# Patient Record
Sex: Male | Born: 1940 | Race: Black or African American | Hispanic: No | Marital: Single | Smoking: Current every day smoker
Health system: Southern US, Community
[De-identification: ages and names within clinical notes are randomized; demographics above are authoritative.]

## PROBLEM LIST (undated history)

## (undated) DIAGNOSIS — K219 Gastro-esophageal reflux disease without esophagitis: Secondary | ICD-10-CM

---

## 2006-05-07 ENCOUNTER — Inpatient Hospital Stay: Payer: Self-pay | Admitting: Vascular Surgery

## 2006-05-15 ENCOUNTER — Emergency Department: Payer: Self-pay | Admitting: Emergency Medicine

## 2013-11-08 DIAGNOSIS — R5383 Other fatigue: Secondary | ICD-10-CM | POA: Insufficient documentation

## 2013-11-08 DIAGNOSIS — R5381 Other malaise: Secondary | ICD-10-CM | POA: Insufficient documentation

## 2013-11-08 DIAGNOSIS — R7989 Other specified abnormal findings of blood chemistry: Secondary | ICD-10-CM | POA: Insufficient documentation

## 2013-11-08 DIAGNOSIS — R410 Disorientation, unspecified: Secondary | ICD-10-CM | POA: Insufficient documentation

## 2013-11-08 DIAGNOSIS — I10 Essential (primary) hypertension: Secondary | ICD-10-CM | POA: Insufficient documentation

## 2013-11-08 DIAGNOSIS — R3915 Urgency of urination: Secondary | ICD-10-CM | POA: Insufficient documentation

## 2013-11-08 DIAGNOSIS — R059 Cough, unspecified: Secondary | ICD-10-CM | POA: Insufficient documentation

## 2013-11-08 DIAGNOSIS — R6883 Chills (without fever): Secondary | ICD-10-CM | POA: Insufficient documentation

## 2013-11-08 DIAGNOSIS — J069 Acute upper respiratory infection, unspecified: Secondary | ICD-10-CM | POA: Insufficient documentation

## 2015-07-16 ENCOUNTER — Emergency Department (HOSPITAL_COMMUNITY): Payer: Medicare Other

## 2015-07-16 ENCOUNTER — Encounter (HOSPITAL_COMMUNITY): Payer: Self-pay | Admitting: *Deleted

## 2015-07-16 ENCOUNTER — Emergency Department (HOSPITAL_COMMUNITY)
Admission: EM | Admit: 2015-07-16 | Discharge: 2015-07-16 | Disposition: A | Discharge status: Home / Self Care | Payer: Medicare Other | Attending: Emergency Medicine | Admitting: Emergency Medicine

## 2015-07-16 DIAGNOSIS — R0981 Nasal congestion: Secondary | ICD-10-CM | POA: Insufficient documentation

## 2015-07-16 DIAGNOSIS — R079 Chest pain, unspecified: Secondary | ICD-10-CM | POA: Diagnosis not present

## 2015-07-16 DIAGNOSIS — Z590 Homelessness: Secondary | ICD-10-CM | POA: Diagnosis not present

## 2015-07-16 DIAGNOSIS — J3489 Other specified disorders of nose and nasal sinuses: Secondary | ICD-10-CM | POA: Diagnosis not present

## 2015-07-16 DIAGNOSIS — R5383 Other fatigue: Secondary | ICD-10-CM | POA: Diagnosis not present

## 2015-07-16 DIAGNOSIS — F17200 Nicotine dependence, unspecified, uncomplicated: Secondary | ICD-10-CM | POA: Diagnosis not present

## 2015-07-16 DIAGNOSIS — R509 Fever, unspecified: Secondary | ICD-10-CM | POA: Diagnosis not present

## 2015-07-16 DIAGNOSIS — R51 Headache: Secondary | ICD-10-CM | POA: Diagnosis not present

## 2015-07-16 DIAGNOSIS — R0602 Shortness of breath: Secondary | ICD-10-CM | POA: Insufficient documentation

## 2015-07-16 DIAGNOSIS — R059 Cough, unspecified: Secondary | ICD-10-CM

## 2015-07-16 DIAGNOSIS — Z87828 Personal history of other (healed) physical injury and trauma: Secondary | ICD-10-CM | POA: Insufficient documentation

## 2015-07-16 DIAGNOSIS — Z880 Allergy status to penicillin: Secondary | ICD-10-CM | POA: Insufficient documentation

## 2015-07-16 DIAGNOSIS — R05 Cough: Secondary | ICD-10-CM | POA: Diagnosis present

## 2015-07-16 LAB — URINALYSIS, ROUTINE W REFLEX MICROSCOPIC
Bilirubin Urine: NEGATIVE
GLUCOSE, UA: NEGATIVE mg/dL
KETONES UR: NEGATIVE mg/dL
LEUKOCYTES UA: NEGATIVE
NITRITE: NEGATIVE
PROTEIN: 100 mg/dL — AB
Specific Gravity, Urine: 1.019 (ref 1.005–1.030)
pH: 5.5 (ref 5.0–8.0)

## 2015-07-16 LAB — CBC WITH DIFFERENTIAL/PLATELET
Basophils Absolute: 0 10*3/uL (ref 0.0–0.1)
Basophils Relative: 1 %
EOS PCT: 5 %
Eosinophils Absolute: 0.3 10*3/uL (ref 0.0–0.7)
HCT: 45.3 % (ref 39.0–52.0)
Hemoglobin: 15.4 g/dL (ref 13.0–17.0)
LYMPHS ABS: 1.6 10*3/uL (ref 0.7–4.0)
LYMPHS PCT: 24 %
MCH: 31.9 pg (ref 26.0–34.0)
MCHC: 34 g/dL (ref 30.0–36.0)
MCV: 93.8 fL (ref 78.0–100.0)
Monocytes Absolute: 0.6 10*3/uL (ref 0.1–1.0)
Monocytes Relative: 9 %
Neutro Abs: 4 10*3/uL (ref 1.7–7.7)
Neutrophils Relative %: 61 %
PLATELETS: 206 10*3/uL (ref 150–400)
RBC: 4.83 MIL/uL (ref 4.22–5.81)
RDW: 14.6 % (ref 11.5–15.5)
WBC: 6.5 10*3/uL (ref 4.0–10.5)

## 2015-07-16 LAB — I-STAT TROPONIN, ED: TROPONIN I, POC: 0 ng/mL (ref 0.00–0.08)

## 2015-07-16 LAB — BASIC METABOLIC PANEL
Anion gap: 7 (ref 5–15)
BUN: 19 mg/dL (ref 6–20)
CHLORIDE: 108 mmol/L (ref 101–111)
CO2: 24 mmol/L (ref 22–32)
Calcium: 9 mg/dL (ref 8.9–10.3)
Creatinine, Ser: 1.55 mg/dL — ABNORMAL HIGH (ref 0.61–1.24)
GFR calc Af Amer: 50 mL/min — ABNORMAL LOW (ref >60)
GFR calc non Af Amer: 43 mL/min — ABNORMAL LOW (ref >60)
GLUCOSE: 95 mg/dL (ref 65–99)
POTASSIUM: 4.1 mmol/L (ref 3.5–5.1)
SODIUM: 139 mmol/L (ref 135–145)

## 2015-07-16 LAB — URINE MICROSCOPIC-ADD ON: Bacteria, UA: NONE SEEN

## 2015-07-16 MED ORDER — IPRATROPIUM BROMIDE 0.02 % IN SOLN
0.5000 mg | Freq: Once | RESPIRATORY_TRACT | Status: AC
  Administered 2015-07-16: 0.5 mg via RESPIRATORY_TRACT
  Filled 2015-07-16: qty 2.5

## 2015-07-16 MED ORDER — SODIUM CHLORIDE 0.9 % IV BOLUS (SEPSIS)
1000.0000 mL | Freq: Once | INTRAVENOUS | Status: AC
  Administered 2015-07-16: 1000 mL via INTRAVENOUS

## 2015-07-16 MED ORDER — ACETAMINOPHEN 325 MG PO TABS
650.0000 mg | ORAL_TABLET | Freq: Once | ORAL | Status: AC
  Administered 2015-07-16: 650 mg via ORAL
  Filled 2015-07-16: qty 2

## 2015-07-16 MED ORDER — ALBUTEROL SULFATE (2.5 MG/3ML) 0.083% IN NEBU
5.0000 mg | INHALATION_SOLUTION | Freq: Once | RESPIRATORY_TRACT | Status: AC
  Administered 2015-07-16: 5 mg via RESPIRATORY_TRACT
  Filled 2015-07-16: qty 6

## 2015-07-16 MED ORDER — FLUTICASONE PROPIONATE 50 MCG/ACT NA SUSP: 2.0000 | Freq: Every day | NASAL | Status: DC

## 2015-07-16 MED ORDER — ALBUTEROL SULFATE (2.5 MG/3ML) 0.083% IN NEBU
2.5000 mg | INHALATION_SOLUTION | Freq: Once | RESPIRATORY_TRACT | Status: AC
  Administered 2015-07-16: 2.5 mg via RESPIRATORY_TRACT
  Filled 2015-07-16: qty 3

## 2015-07-16 MED ORDER — METHYLPREDNISOLONE SODIUM SUCC 125 MG IJ SOLR
125.0000 mg | Freq: Once | INTRAMUSCULAR | Status: AC
  Administered 2015-07-16: 125 mg via INTRAVENOUS
  Filled 2015-07-16: qty 2

## 2015-07-16 MED ORDER — ALBUTEROL SULFATE HFA 108 (90 BASE) MCG/ACT IN AERS: 1.0000 | INHALATION_SPRAY | Freq: Four times a day (QID) | RESPIRATORY_TRACT | Status: DC | PRN

## 2015-07-16 MED ORDER — ACETAMINOPHEN 500 MG PO TABS: 500.0000 mg | ORAL_TABLET | Freq: Four times a day (QID) | ORAL | Status: DC | PRN

## 2015-07-16 NOTE — ED Notes (Signed)
Pt cannot use restroom at this time, aware urine specimen is needed.  

## 2015-07-16 NOTE — ED Notes (Signed)
Per EMS, pt reports getting dx with flu about a week ago.  Reports cold sxs, nasal congestion and cough.
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
## 2015-07-16 NOTE — ED Notes (Signed)
Bed: WA18 Expected date:  Expected time:  Means of arrival:  Comments: EMS-flu like symptoms 

## 2015-07-16 NOTE — Discharge Instructions (Signed)
Cough, Adult °Coughing is a reflex that clears your throat and your airways. Coughing helps to heal and protect your lungs. It is normal to cough occasionally, but a cough that happens with other symptoms or lasts a long time may be a sign of a condition that needs treatment. A cough may last only 2-3 weeks (acute), or it may last longer than 8 weeks (chronic). °CAUSES °Coughing is commonly caused by: °· Breathing in substances that irritate your lungs. °· A viral or bacterial respiratory infection. °· Allergies. °· Asthma. °· Postnasal drip. °· Smoking. °· Acid backing up from the stomach into the esophagus (gastroesophageal reflux). °· Certain medicines. °· Chronic lung problems, including COPD (or rarely, lung cancer). °· Other medical conditions such as heart failure. °HOME CARE INSTRUCTIONS  °Pay attention to any changes in your symptoms. Take these actions to help with your discomfort: °· Take medicines only as told by your health care provider. °¨ If you were prescribed an antibiotic medicine, take it as told by your health care provider. Do not stop taking the antibiotic even if you start to feel better. °¨ Talk with your health care provider before you take a cough suppressant medicine. °· Drink enough fluid to keep your urine clear or pale yellow. °· If the air is dry, use a cold steam vaporizer or humidifier in your bedroom or your home to help loosen secretions. °· Avoid anything that causes you to cough at work or at home. °· If your cough is worse at night, try sleeping in a semi-upright position. °· Avoid cigarette smoke. If you smoke, quit smoking. If you need help quitting, ask your health care provider. °· Avoid caffeine. °· Avoid alcohol. °· Rest as needed. °SEEK MEDICAL CARE IF:  °· You have new symptoms. °· You cough up pus. °· Your cough does not get better after 2-3 weeks, or your cough gets worse. °· You cannot control your cough with suppressant medicines and you are losing sleep. °· You  develop pain that is getting worse or pain that is not controlled with pain medicines. °· You have a fever. °· You have unexplained weight loss. °· You have night sweats. °SEEK IMMEDIATE MEDICAL CARE IF: °· You cough up blood. °· You have difficulty breathing. °· Your heartbeat is very fast. °  °This information is not intended to replace advice given to you by your health care provider. Make sure you discuss any questions you have with your health care provider. °  °Document Released: 12/13/2010 Document Revised: 03/07/2015 Document Reviewed: 08/23/2014 °Elsevier Interactive Patient Education ©2016 Elsevier Inc. ° °Emergency Department Resource Guide °1) Find a Doctor and Pay Out of Pocket °Although you won't have to find out who is covered by your insurance plan, it is a good idea to ask around and get recommendations. You will then need to call the office and see if the doctor you have chosen will accept you as a new patient and what types of options they offer for patients who are self-pay. Some doctors offer discounts or will set up payment plans for their patients who do not have insurance, but you will need to ask so you aren't surprised when you get to your appointment. ° °2) Contact Your Local Health Department °Not all health departments have doctors that can see patients for sick visits, but many do, so it is worth a call to see if yours does. If you don't know where your local health department is, you can check in   your phone book. The CDC also has a tool to help you locate your state's health department, and many state websites also have listings of all of their local health departments. ° °3) Find a Walk-in Clinic °If your illness is not likely to be very severe or complicated, you may want to try a walk in clinic. These are popping up all over the country in pharmacies, drugstores, and shopping centers. They're usually staffed by nurse practitioners or physician assistants that have been trained to  treat common illnesses and complaints. They're usually fairly quick and inexpensive. However, if you have serious medical issues or chronic medical problems, these are probably not your best option. ° °No Primary Care Doctor: °- Call [facility] at  832-8000 - they can help you locate a primary care doctor that  accepts your insurance, provides certain services, etc. °- Physician Referral Service- 1-800-533-3463 ° °Chronic Pain Problems: °Organization         Address  Phone   Notes  °[facility]  (336) 297-2271 Patients need to be referred by their primary care doctor.  ° °Medication Assistance: °Organization         Address  Phone   Notes  °[place] [facility] [address] °[place], [place] [address] (336) 641-8030 --Must be a resident of [place] °-- Must have NO insurance coverage whatsoever (no Medicaid/ Medicare, etc.) °-- The pt. MUST have a primary care doctor that directs their care regularly and follows them in the community °  °[facility]  (866) 331-1348   °[employer]  (888) 892-1162   ° °Agencies that provide inexpensive medical care: °Organization         Address  Phone   Notes  °[facility]  (336) 832-8035   °[facility]    (336) 832-7272   °[facility] [address] °[place], [place] [address] (336) 832-4777   °[facility] [address], °[place] (336) 271-4999   °[facility]    (336) 373-0678   °[facility]    (336) 272-1050   °[facility] ° [address], [place] Phone:  (336) 832-4444, Fax:  (336) 832-4440 Hours of Operation:  9 am - 6 pm, M-F.  Also accepts Medicaid/Medicare and self-pay.  °[facility] ° [address], [place] Phone: (336) 832-3150, Fax: (336) 832-3151. Hours of Operation:  8:30 am - 5:30 pm, M-F.  Also accepts Medicaid and self-pay.  °[facility]  [place] [address], [place] Phone: (336) 878-6027   °[facility] [address], [place], [place] (336)723-1848, Ext. 123 Mondays & Thursdays: 7-9 AM.  First 15 patients are seen on a first come, first serve basis. °  ° °Medicaid-accepting [place] Providers: ° °Organization         Address  Phone   Notes  °[facility] [address], [place] (336) 641-2100 Also accepts self-pay patients.  °[facility] [address], [place] ° (336) 856-9996   °[facility] [address], [place] (336) 288-8857   °[facility] [address] [place] [address], [place] (336) 299-7000   °Veita Bland [address], [place]  ° (336) 373-1557 Only accepts [place] Access Medicaid patients after they have their name applied to their card.  ° °  Self-Pay (no insurance) in [place]: ° °Organization         Address  Phone   Notes  °Sickle Cell Patients, [facility] [address], [place] (336) 832-1970   °[facility] [address], [place] (336) 832-4400   °[facility] ° [address], [place] (336) 992-4800   °[facility]/Dr. Osei-Bonsu ° [address] [place] [address], [place] or [address], [place] (336) 841-8500 Phone number for both [place] and [place] locations is the same.  °[facility] [address], [place] (336) 299-0000   °[facility] [address] [place] [address], [place] or [address] (336) 852-7530 °(336) 878-2260   °[facility] [address], [place] (336) 350-1642, phone; (336) 294-5005, fax Sees patients 1st and 3rd Saturday of every month.  Must not qualify for public or private insurance (i.e. Medicaid, Medicare, [place] Health Choice, Veterans' Benefits) • Household income should be no more than 200%  of the poverty level •The clinic cannot treat you if you are pregnant or think you are pregnant • Sexually transmitted diseases are not treated at the clinic.  ° ° °Dental Care: °Organization         Address  Phone  Notes  °[facility] [address],  (336) 641-6152 Accepts children up to age 21 who are enrolled in Medicaid or [place] Health Choice; pregnant women with a Medicaid card; and children who have applied for Medicaid or [place] Health Choice, but were declined, whose parents can pay a reduced fee at time of service.  °[facility] [place]  [address], [place] (336) 641-7733 Accepts children up to age 21 who are enrolled in Medicaid or [place] Health Choice; pregnant women with a Medicaid card; and children who have applied for Medicaid or [place] Health Choice, but were declined, whose parents can pay a reduced fee at time of service.  °[facility] ° [address],  (336) 641-4533 Patients are seen by appointment only. Walk-ins are not accepted. [facility] will see patients 18 years of age and older. °Monday - Tuesday (8am-5pm) °Most Wednesdays (8:30-5pm) °$30 per visit, cash only  °[facility] ° [address], [place] (336) 641-4533 Patients are seen by appointment only. Walk-ins are not accepted. [facility] will see patients 18 years of age and older. °One Wednesday Evening (Monthly: Volunteer Based).  $30 per visit, cash only  °[facility]  (919) 537-3737 for adults; Children under age 4, call Graduate Pediatric Dentistry at ([phone]) [phone]. Children aged 4-14, please call (919) [phone] to request a pediatric application. ° Dental services are provided in all areas of dental care including fillings, crowns and bridges, complete and partial dentures, implants, gum treatment, root canals, and  extractions. Preventive care is also provided. Treatment is provided to both adults and children. °Patients are selected via a lottery and there is often a waiting list. °  °[facility] [address], ° ° (336) 763-8833 [email or web address] °  °[facility] [address], [place], [place] (336)723-1848, Ext. 123 Second and Fourth Thursday of each month, opens at 6:30 AM; Clinic ends at 9 AM.  Patients are seen on a first-come first-served basis, and a limited number   are seen during each clinic.  ° °[facility] ° [address], [place], [place] (336) 723-7904   Eligibility Requirements °You must have lived in [place], [place], or [place] counties for at least the last three months. °  You cannot be eligible for state or federal sponsored healthcare insurance, including Veterans Administration, Medicaid, or Medicare. °  You generally cannot be eligible for healthcare insurance through your employer.  °  How to apply: °Eligibility screenings are held every Tuesday and Wednesday afternoon from 1:00 pm until 4:00 pm. You do not need an appointment for the interview!  °[facility] [address], [place], [place] 336-631-2330   °[facility]  336-342-8273   °[facility]  336-703-3100   °[facility]  336-570-6415   ° °Behavioral Health Resources in the Community: °Intensive Outpatient Programs °Organization         Address  Phone  Notes  °[facility] [address], [place], [place] 336-878-6098   °[facility] [address], [place], [place] 336-832-9800   °[facility] [address], [place], [place] ° 336-882-2125   °[facility] [address],  °[place], [place] 1-800-853-5163 or 336-641-4981   °Substance Abuse Resources °Organization         Address  Phone  Notes  °[facility]  336-882-2125     °[facility]  336-784-9470   °[facility]  336-285-9073   °[facility]  336-845-3988   °Residential & Outpatient Substance Abuse Program  1-800-659-3381   °Psychological Services °Organization         Address  Phone  Notes  °[facility]  336- 832-9600   °[facility]  336- 378-7881   °[facility] [address], [place] 1-800-853-5163 or 336-641-4981   ° °Mobile Crisis Teams °Organization         Address  Phone  Notes  °[facility], Mobile Crisis Care Unit  1-877-626-1772   °Assertive °[facility] ° [address] [place], [place] 336-834-9664   °Sharon DeEsch [address] °[place] 336-554-5454   ° °Self-Help/Support Groups °Organization         Address  Phone             Notes  °[facility] [place] - variety of support groups  336- 373-1402 Call for more information  °Narcotics Anonymous (NA), [facility] [address], °[place]  2 meetings at this location  ° °Residential Treatment Programs °Organization         Address  Phone  Notes  °[facility] [address],    °[place]  1-866-801-8205   °[facility] ° [address], [place], [place] 704-293-8524   °[facility] [address], [place] 336-845-3988 Admissions: 8am-3pm M-F  °[facility] [address]    °[place], [place] 336-841-1104   °[facility] [address], [place], [place] 336-379-7146   °[facility] [address]  °[place], [place] 336-285-9073   °[facility] - Intensive Outpatient [address], , [place] 336-852-3033   °[facility] [address]  °[place], [place] 1-877-615-2722 or 336-784-9470   °[facility] [address] [place], [place] 336-227-7417 Accepts Medicaid  °[facility] [address]   °[place] 1-800-659-3381 Substance Abuse/Addiction Treatment  ° °[place] Behavioral Health Resources °Organization         Address  Phone  Notes  °[facility]  (888) 581-9988   °Julie Brannon, PhD [address] [place], [place]   (336) 349-5553 or (336) 951-0000   °[facility]   [address] °[place], [place] (336) 349-4454   °[facility] [address], [place], [place] (336) 342-8316 Insurance/Medicaid/sponsorship through [facility]  °[facility] [address]                                    [place], [place] (336) 342-8316 Therapy/tele-psych/case  °[facility] [address]  ° [place], [place] (336) 349-2233    °Dr. Arfeen  (336) 349-4544   °[facility] [place]  [employer] [place] [facility] 1) [address], [place] °2) [address], [place] °3)  [address], [place] (336) 349-3220 °(336) 342-7768 ° °(336) 342-8140   °[place] [facility] (336) 342-1394 or (336) 342-3537 (After Hours)    ° ° ° °

## 2015-07-18 LAB — URINE CULTURE: CULTURE: NO GROWTH

## 2016-06-02 ENCOUNTER — Encounter: Payer: Self-pay | Admitting: Emergency Medicine

## 2016-06-02 ENCOUNTER — Emergency Department: Payer: Medicare Other

## 2016-06-02 ENCOUNTER — Emergency Department
Admission: EM | Admit: 2016-06-02 | Discharge: 2016-06-02 | Disposition: A | Discharge status: Home / Self Care | Payer: Medicare Other | Attending: Emergency Medicine | Admitting: Emergency Medicine

## 2016-06-02 DIAGNOSIS — M79672 Pain in left foot: Secondary | ICD-10-CM | POA: Diagnosis not present

## 2016-06-02 DIAGNOSIS — F17200 Nicotine dependence, unspecified, uncomplicated: Secondary | ICD-10-CM | POA: Diagnosis not present

## 2016-06-02 DIAGNOSIS — M7731 Calcaneal spur, right foot: Secondary | ICD-10-CM | POA: Insufficient documentation

## 2016-06-02 DIAGNOSIS — M79671 Pain in right foot: Secondary | ICD-10-CM

## 2016-06-02 DIAGNOSIS — Z79899 Other long term (current) drug therapy: Secondary | ICD-10-CM | POA: Insufficient documentation

## 2016-06-02 LAB — CBC WITH DIFFERENTIAL/PLATELET
Basophils Absolute: 0.1 10*3/uL (ref 0–0.1)
Basophils Relative: 1 %
EOS ABS: 0.1 10*3/uL (ref 0–0.7)
Eosinophils Relative: 2 %
HCT: 47.5 % (ref 40.0–52.0)
HEMOGLOBIN: 16.1 g/dL (ref 13.0–18.0)
LYMPHS ABS: 1.9 10*3/uL (ref 1.0–3.6)
LYMPHS PCT: 23 %
MCH: 31.8 pg (ref 26.0–34.0)
MCHC: 33.9 g/dL (ref 32.0–36.0)
MCV: 93.7 fL (ref 80.0–100.0)
MONOS PCT: 9 %
Monocytes Absolute: 0.7 10*3/uL (ref 0.2–1.0)
NEUTROS PCT: 65 %
Neutro Abs: 5.2 10*3/uL (ref 1.4–6.5)
Platelets: 167 10*3/uL (ref 150–440)
RBC: 5.07 MIL/uL (ref 4.40–5.90)
RDW: 14.6 % — ABNORMAL HIGH (ref 11.5–14.5)
WBC: 8 10*3/uL (ref 3.8–10.6)

## 2016-06-02 LAB — BASIC METABOLIC PANEL
Anion gap: 6 (ref 5–15)
BUN: 31 mg/dL — AB (ref 6–20)
CHLORIDE: 108 mmol/L (ref 101–111)
CO2: 28 mmol/L (ref 22–32)
CREATININE: 1.94 mg/dL — AB (ref 0.61–1.24)
Calcium: 9.5 mg/dL (ref 8.9–10.3)
GFR calc Af Amer: 38 mL/min — ABNORMAL LOW (ref >60)
GFR calc non Af Amer: 33 mL/min — ABNORMAL LOW (ref >60)
GLUCOSE: 86 mg/dL (ref 65–99)
Potassium: 4.3 mmol/L (ref 3.5–5.1)
SODIUM: 142 mmol/L (ref 135–145)

## 2016-06-02 LAB — GLUCOSE, CAPILLARY: GLUCOSE-CAPILLARY: 80 mg/dL (ref 65–99)

## 2016-06-02 MED ORDER — NAPROXEN 500 MG PO TBEC: 500.0000 mg | DELAYED_RELEASE_TABLET | Freq: Two times a day (BID) | ORAL | 0 refills | Status: DC

## 2016-06-02 MED ORDER — ACETAMINOPHEN 325 MG PO TABS
650.0000 mg | ORAL_TABLET | Freq: Once | ORAL | Status: AC
  Administered 2016-06-02: 650 mg via ORAL
  Filled 2016-06-02: qty 2

## 2016-06-02 NOTE — ED Provider Notes (Signed)
[facility] Emergency Department Provider Note ____________________________________________  Time seen: 1612  I have reviewed the triage vital signs and the nursing notes.  HISTORY  Chief Complaint  Back Pain and Foot Pain  HPI Tyler Sawyer is a 75 y.o. male is a homeless patient who comes to the ED after spending the night on his adult son's couch last night. He reports that he is unable to secure a bed at the local shelter, and reports tomorrow he will have to complete some paperwork to be housed there. [place]. He's had over the last 6 months increasing, intermittent left greater than right pain at the heel. He describes achiness in his legs as well as pain across the tops of his feet. He denies any recent injury, accident, or trauma. Denies any fevers, chills, sweats, dysuria, hematuria, or abdominal pain.  History reviewed. No pertinent past medical history.  There are no active problems to display for this patient.  History reviewed. No pertinent surgical history.  Prior to Admission medications   Medication Sig Start Date End Date Taking? Authorizing Provider  acetaminophen (TYLENOL) 500 MG tablet Take 1 tablet (500 mg total) by mouth every 6 (six) hours as needed. 07/16/15   Cheri FowlerKayla Rose, PA-C  albuterol (PROVENTIL HFA;VENTOLIN HFA) 108 (90 Base) MCG/ACT inhaler Inhale 1-2 puffs into the lungs every 6 (six) hours as needed for wheezing or shortness of breath. 07/16/15   Kayla Rose, PA-C  DM-Phenylephrine-Acetaminophen (TYLENOL COLD MAX PO) Take 2 tablets by mouth every 4 (four) hours as needed (cold symptoms).    Historical Provider, MD  fluticasone (FLONASE) 50 MCG/ACT nasal spray Place 2 sprays into both nostrils daily. 07/16/15   Cheri FowlerKayla Rose, PA-C  naproxen (EC NAPROSYN) 500 MG EC tablet Take 1 tablet (500 mg total) by mouth 2 (two) times daily with a meal. 06/02/16   Sumeet Geter V Bacon Kadience Macchi, PA-C     Allergies Penicillins  History reviewed. No pertinent family history.  Social History Social History  Substance Use Topics  . Smoking status: Current Some Day Smoker  . Smokeless tobacco: Never Used  . Alcohol use No    Review of Systems  Constitutional: Negative for fever. Cardiovascular: Negative for chest pain. Respiratory: Negative for shortness of breath. Gastrointestinal: Negative for abdominal pain, vomiting and diarrhea. Genitourinary: Negative for dysuria. Musculoskeletal: Negative for back pain. Right >left foot pain as above  Skin: Negative for rash. Neurological: Negative for headaches, focal weakness or numbness. ____________________________________________  PHYSICAL EXAM:  VITAL SIGNS: ED Triage Vitals  Enc Vitals Group     BP 06/02/16 1454 115/80     Pulse Rate 06/02/16 1454 100     Resp 06/02/16 1454 18     Temp 06/02/16 1454 98.8 F (37.1 C)     Temp Source 06/02/16 1454 Oral     SpO2 06/02/16 1454 97 %     Weight 06/02/16 1455 200 lb (90.7 kg)     Height 06/02/16 1455 6' (1.829 m)     Head Circumference --      Peak Flow --      Pain Score 06/02/16 1455 7     Pain Loc --      Pain Edu? --      Excl. in GC? --    Constitutional: Alert and oriented. Well appearing and in no distress. Head: Normocephalic and atraumatic. Cardiovascular: Normal S1, S2. Irregular rhythm. Normal distal pulses. Respiratory: Normal respiratory effort.  No wheezes/rales/rhonchi. Gastrointestinal: Soft and nontender. No distention. Musculoskeletal: Bilateral feet and ankles without obvious deformity, effusion, or edema. Patient is exquisitely tender to palpation to the posterior calcaneal heel at the Achilles attachment. His ankle exam is unremarkable bilaterally. Nontender with normal range of motion in all extremities.  Neurologic:  Normal gait without ataxia. Normal speech and language. No gross focal neurologic deficits are appreciated. Skin:  Skin is warm, dry and  intact. No rash noted. Psychiatric: Mood and affect are normal. Patient exhibits appropriate insight and judgment. ____________________________________________   LABS (pertinent positives/negatives) Labs Reviewed  CBC WITH DIFFERENTIAL/PLATELET - Abnormal; Notable for the following:       Result Value   RDW 14.6 (*)    All other components within normal limits  BASIC METABOLIC PANEL - Abnormal; Notable for the following:    BUN 31 (*)    Creatinine, Ser 1.94 (*)    GFR calc non Af Amer 33 (*)    GFR calc Af Amer 38 (*)    All other components within normal limits  GLUCOSE, CAPILLARY  ____________________________________________   RADIOLOGY  Right Foot FINDINGS: There is no evidence of fracture or dislocation. There is no evidence of arthropathy or other focal bone abnormality. Soft tissues are unremarkable. Mild calcaneal spurring noted.  I, Ahniya Mitchum, Charlesetta IvoryJenise V Bacon, personally viewed and evaluated these images (plain radiographs) as part of my medical decision making, as well as reviewing the written report by the radiologist. ____________________________________________  PROCEDURES  Tylenol 650 mg PO [place] Tray ____________________________________________  INITIAL IMPRESSION / ASSESSMENT AND PLAN / ED COURSE  Patient with chronic left greater than right heel pain that appears to be consistent with acute heel spur and tendinitis. Patient was advised to seek refuge at the local shelter, as prolonged walking would aggravate his current condition. He is further advised to consider shoe lift on inserts. He is referred to podiatry for continued symptoms. He will follow-up with one of the local community clinics for routine medical follow-up.   Clinical Course    ____________________________________________  FINAL CLINICAL IMPRESSION(S) / ED DIAGNOSES  Final diagnoses:  Heel pain, bilateral  Heel spur, right      Lissa HoardJenise V Bacon Wakeelah Solan, PA-C 06/02/16 2324     Phineas SemenGraydon Goodman, MD 06/02/16 2336

## 2016-06-02 NOTE — ED Triage Notes (Signed)
Reports walking a lot, [place].  C/o left heel pain and lower back pain.  NAD

## 2016-06-02 NOTE — Discharge Instructions (Signed)
Take the prescription med as directed. Follow-up with your provider, local community clinic, or Dr. Ether GriffinsFowler for continued symptoms. Consider buying heel lifts or shoe inserts for support.

## 2016-08-03 ENCOUNTER — Emergency Department
Admission: EM | Admit: 2016-08-03 | Discharge: 2016-08-03 | Disposition: A | Discharge status: Home / Self Care | Payer: Medicare Other | Attending: Student in an Organized Health Care Education/Training Program | Admitting: Student in an Organized Health Care Education/Training Program

## 2016-08-03 ENCOUNTER — Encounter: Payer: Self-pay | Admitting: Emergency Medicine

## 2016-08-03 DIAGNOSIS — F17210 Nicotine dependence, cigarettes, uncomplicated: Secondary | ICD-10-CM | POA: Insufficient documentation

## 2016-08-03 DIAGNOSIS — R079 Chest pain, unspecified: Secondary | ICD-10-CM | POA: Diagnosis not present

## 2016-08-03 DIAGNOSIS — R197 Diarrhea, unspecified: Secondary | ICD-10-CM | POA: Diagnosis not present

## 2016-08-03 DIAGNOSIS — Z5321 Procedure and treatment not carried out due to patient leaving prior to being seen by health care provider: Secondary | ICD-10-CM | POA: Insufficient documentation

## 2016-08-03 DIAGNOSIS — R1013 Epigastric pain: Secondary | ICD-10-CM | POA: Diagnosis not present

## 2016-08-03 HISTORY — DX: Gastro-esophageal reflux disease without esophagitis: K21.9

## 2016-08-03 LAB — COMPREHENSIVE METABOLIC PANEL
ALK PHOS: 98 U/L (ref 38–126)
ALT: 20 U/L (ref 17–63)
AST: 30 U/L (ref 15–41)
Albumin: 3.8 g/dL (ref 3.5–5.0)
Anion gap: 8 (ref 5–15)
BILIRUBIN TOTAL: 0.6 mg/dL (ref 0.3–1.2)
BUN: 24 mg/dL — AB (ref 6–20)
CALCIUM: 8.9 mg/dL (ref 8.9–10.3)
CO2: 23 mmol/L (ref 22–32)
CREATININE: 1.75 mg/dL — AB (ref 0.61–1.24)
Chloride: 107 mmol/L (ref 101–111)
GFR calc Af Amer: 43 mL/min — ABNORMAL LOW (ref >60)
GFR, EST NON AFRICAN AMERICAN: 37 mL/min — AB (ref >60)
GLUCOSE: 107 mg/dL — AB (ref 65–99)
POTASSIUM: 3.8 mmol/L (ref 3.5–5.1)
Sodium: 138 mmol/L (ref 135–145)
TOTAL PROTEIN: 7.5 g/dL (ref 6.5–8.1)

## 2016-08-03 LAB — CBC
HEMATOCRIT: 46.2 % (ref 40.0–52.0)
Hemoglobin: 15.7 g/dL (ref 13.0–18.0)
MCH: 31.4 pg (ref 26.0–34.0)
MCHC: 34 g/dL (ref 32.0–36.0)
MCV: 92.4 fL (ref 80.0–100.0)
PLATELETS: 164 10*3/uL (ref 150–440)
RBC: 5 MIL/uL (ref 4.40–5.90)
RDW: 14.6 % — AB (ref 11.5–14.5)
WBC: 6.4 10*3/uL (ref 3.8–10.6)

## 2016-08-03 LAB — LIPASE, BLOOD: Lipase: 39 U/L (ref 11–51)

## 2016-08-03 NOTE — ED Notes (Signed)
Red and black pocket knife given to [facility] to secure until discharge

## 2016-08-03 NOTE — ED Triage Notes (Addendum)
Pt c/o intermittent burning to the center of his chest for about 5 years; before midnight he began having N/V/D; c/o continued epigastric pain; pain is non-radiating; pt stays at the homeless shelter

## 2016-08-03 NOTE — ED Notes (Signed)
Pt finished in bathroom; says he's had 10-15 watery stools in the night

## 2016-08-03 NOTE — ED Notes (Signed)
Had to stop in the middle of triage to take bathroom via wheelchair for sudden urge to have bowel movement;

## 2016-08-04 ENCOUNTER — Telehealth: Payer: Self-pay | Admitting: Emergency Medicine

## 2016-08-04 NOTE — Telephone Encounter (Signed)
Called patient due to lwot to inquire about condition and follow up plans. Left message.   

## 2016-08-07 ENCOUNTER — Telehealth: Payer: Self-pay | Admitting: Emergency Medicine

## 2016-08-07 NOTE — Telephone Encounter (Signed)
Called patient to inform that his knife is in the security safe.  Left message asking him to call me

## 2017-03-18 ENCOUNTER — Inpatient Hospital Stay (HOSPITAL_COMMUNITY)
Admission: EM | Admit: 2017-03-18 | Discharge: 2017-03-21 | DRG: 309 | Disposition: A | Discharge status: Home / Self Care | Payer: Medicare Other | Attending: Internal Medicine | Admitting: Internal Medicine

## 2017-03-18 ENCOUNTER — Observation Stay (HOSPITAL_COMMUNITY): Payer: Medicare Other

## 2017-03-18 ENCOUNTER — Encounter (HOSPITAL_COMMUNITY): Payer: Self-pay

## 2017-03-18 DIAGNOSIS — I4891 Unspecified atrial fibrillation: Secondary | ICD-10-CM | POA: Diagnosis not present

## 2017-03-18 DIAGNOSIS — F14129 Cocaine abuse with intoxication, unspecified: Secondary | ICD-10-CM | POA: Diagnosis present

## 2017-03-18 DIAGNOSIS — N179 Acute kidney failure, unspecified: Secondary | ICD-10-CM

## 2017-03-18 DIAGNOSIS — F1410 Cocaine abuse, uncomplicated: Secondary | ICD-10-CM

## 2017-03-18 DIAGNOSIS — R079 Chest pain, unspecified: Secondary | ICD-10-CM | POA: Diagnosis present

## 2017-03-18 DIAGNOSIS — M7918 Myalgia, other site: Secondary | ICD-10-CM

## 2017-03-18 DIAGNOSIS — R519 Headache, unspecified: Secondary | ICD-10-CM

## 2017-03-18 DIAGNOSIS — Z87892 Personal history of anaphylaxis: Secondary | ICD-10-CM

## 2017-03-18 DIAGNOSIS — R739 Hyperglycemia, unspecified: Secondary | ICD-10-CM | POA: Diagnosis not present

## 2017-03-18 DIAGNOSIS — I484 Atypical atrial flutter: Secondary | ICD-10-CM | POA: Diagnosis not present

## 2017-03-18 DIAGNOSIS — I480 Paroxysmal atrial fibrillation: Secondary | ICD-10-CM | POA: Diagnosis present

## 2017-03-18 DIAGNOSIS — M129 Arthropathy, unspecified: Secondary | ICD-10-CM | POA: Diagnosis present

## 2017-03-18 DIAGNOSIS — Z880 Allergy status to penicillin: Secondary | ICD-10-CM

## 2017-03-18 DIAGNOSIS — R55 Syncope and collapse: Secondary | ICD-10-CM | POA: Diagnosis not present

## 2017-03-18 DIAGNOSIS — F1490 Cocaine use, unspecified, uncomplicated: Secondary | ICD-10-CM | POA: Diagnosis not present

## 2017-03-18 DIAGNOSIS — N183 Chronic kidney disease, stage 3 unspecified: Secondary | ICD-10-CM

## 2017-03-18 DIAGNOSIS — I129 Hypertensive chronic kidney disease with stage 1 through stage 4 chronic kidney disease, or unspecified chronic kidney disease: Secondary | ICD-10-CM | POA: Diagnosis present

## 2017-03-18 DIAGNOSIS — I4892 Unspecified atrial flutter: Secondary | ICD-10-CM | POA: Diagnosis not present

## 2017-03-18 DIAGNOSIS — Z833 Family history of diabetes mellitus: Secondary | ICD-10-CM

## 2017-03-18 DIAGNOSIS — M791 Myalgia: Secondary | ICD-10-CM

## 2017-03-18 DIAGNOSIS — F17210 Nicotine dependence, cigarettes, uncomplicated: Secondary | ICD-10-CM | POA: Diagnosis present

## 2017-03-18 DIAGNOSIS — R51 Headache: Secondary | ICD-10-CM

## 2017-03-18 DIAGNOSIS — M542 Cervicalgia: Secondary | ICD-10-CM

## 2017-03-18 LAB — CBC WITH DIFFERENTIAL/PLATELET
BASOS ABS: 0 10*3/uL (ref 0.0–0.1)
BASOS PCT: 0 %
EOS PCT: 2 %
Eosinophils Absolute: 0.1 10*3/uL (ref 0.0–0.7)
HCT: 45.9 % (ref 39.0–52.0)
Hemoglobin: 15.3 g/dL (ref 13.0–17.0)
Lymphocytes Relative: 29 %
Lymphs Abs: 1.7 10*3/uL (ref 0.7–4.0)
MCH: 31 pg (ref 26.0–34.0)
MCHC: 33.3 g/dL (ref 30.0–36.0)
MCV: 92.9 fL (ref 78.0–100.0)
MONO ABS: 0.6 10*3/uL (ref 0.1–1.0)
MONOS PCT: 10 %
Neutro Abs: 3.4 10*3/uL (ref 1.7–7.7)
Neutrophils Relative %: 59 %
Platelets: 151 10*3/uL (ref 150–400)
RBC: 4.94 MIL/uL (ref 4.22–5.81)
RDW: 14.5 % (ref 11.5–15.5)
WBC: 5.7 10*3/uL (ref 4.0–10.5)

## 2017-03-18 LAB — COMPREHENSIVE METABOLIC PANEL
ALBUMIN: 3.5 g/dL (ref 3.5–5.0)
ALT: 21 U/L (ref 17–63)
ANION GAP: 13 (ref 5–15)
AST: 32 U/L (ref 15–41)
Alkaline Phosphatase: 80 U/L (ref 38–126)
BILIRUBIN TOTAL: 1.3 mg/dL — AB (ref 0.3–1.2)
BUN: 22 mg/dL — ABNORMAL HIGH (ref 6–20)
CHLORIDE: 107 mmol/L (ref 101–111)
CO2: 20 mmol/L — AB (ref 22–32)
Calcium: 8.8 mg/dL — ABNORMAL LOW (ref 8.9–10.3)
Creatinine, Ser: 2.28 mg/dL — ABNORMAL HIGH (ref 0.61–1.24)
GFR calc Af Amer: 31 mL/min — ABNORMAL LOW (ref >60)
GFR, EST NON AFRICAN AMERICAN: 27 mL/min — AB (ref >60)
GLUCOSE: 112 mg/dL — AB (ref 65–99)
Potassium: 4.9 mmol/L (ref 3.5–5.1)
Sodium: 140 mmol/L (ref 135–145)
Total Protein: 6.3 g/dL — ABNORMAL LOW (ref 6.5–8.1)

## 2017-03-18 LAB — TROPONIN I
Troponin I: <0.03 ng/mL (ref <0.03)
Troponin I: <0.03 ng/mL (ref <0.03)
Troponin I: <0.03 ng/mL (ref <0.03)

## 2017-03-18 LAB — T4, FREE: Free T4: 0.79 ng/dL (ref 0.61–1.12)

## 2017-03-18 LAB — RAPID URINE DRUG SCREEN, HOSP PERFORMED
AMPHETAMINES: NOT DETECTED
Barbiturates: NOT DETECTED
Benzodiazepines: NOT DETECTED
COCAINE: POSITIVE — AB
OPIATES: NOT DETECTED
TETRAHYDROCANNABINOL: NOT DETECTED

## 2017-03-18 LAB — TSH: TSH: 0.513 u[IU]/mL (ref 0.350–4.500)

## 2017-03-18 LAB — MAGNESIUM: Magnesium: 1.9 mg/dL (ref 1.7–2.4)

## 2017-03-18 LAB — HEMOGLOBIN A1C
Hgb A1c MFr Bld: 6.3 % — ABNORMAL HIGH (ref 4.8–5.6)
Mean Plasma Glucose: 134.11 mg/dL

## 2017-03-18 LAB — PHOSPHORUS: Phosphorus: 3.4 mg/dL (ref 2.5–4.6)

## 2017-03-18 MED ORDER — DILTIAZEM LOAD VIA INFUSION
20.0000 mg | Freq: Once | INTRAVENOUS | Status: AC
  Administered 2017-03-18: 20 mg via INTRAVENOUS
  Filled 2017-03-18: qty 20

## 2017-03-18 MED ORDER — HEPARIN BOLUS VIA INFUSION
4500.0000 [IU] | Freq: Once | INTRAVENOUS | Status: AC
  Administered 2017-03-18: 4500 [IU] via INTRAVENOUS
  Filled 2017-03-18: qty 4500

## 2017-03-18 MED ORDER — HEPARIN (PORCINE) IN NACL 100-0.45 UNIT/ML-% IJ SOLN
1300.0000 [IU]/h | INTRAMUSCULAR | Status: DC
  Administered 2017-03-18 – 2017-03-19 (×2): 1300 [IU]/h via INTRAVENOUS
  Filled 2017-03-18 (×2): qty 250

## 2017-03-18 MED ORDER — SODIUM CHLORIDE 0.9 % IV SOLN
INTRAVENOUS | Status: DC
  Administered 2017-03-19: 01:00:00 via INTRAVENOUS

## 2017-03-18 MED ORDER — OXYCODONE HCL 5 MG PO TABS: 5.0000 mg | ORAL_TABLET | ORAL | Status: DC | PRN

## 2017-03-18 MED ORDER — ONDANSETRON HCL 4 MG/2ML IJ SOLN: 4.0000 mg | Freq: Four times a day (QID) | INTRAMUSCULAR | Status: DC | PRN

## 2017-03-18 MED ORDER — ONDANSETRON HCL 4 MG PO TABS: 4.0000 mg | ORAL_TABLET | Freq: Four times a day (QID) | ORAL | Status: DC | PRN

## 2017-03-18 MED ORDER — ACETAMINOPHEN 650 MG RE SUPP: 650.0000 mg | Freq: Four times a day (QID) | RECTAL | Status: DC | PRN

## 2017-03-18 MED ORDER — ACETAMINOPHEN 500 MG PO TABS: 1000.0000 mg | ORAL_TABLET | Freq: Four times a day (QID) | ORAL | Status: DC | PRN

## 2017-03-18 MED ORDER — SODIUM CHLORIDE 0.9% FLUSH
3.0000 mL | Freq: Two times a day (BID) | INTRAVENOUS | Status: DC
  Administered 2017-03-20: 3 mL via INTRAVENOUS

## 2017-03-18 MED ORDER — DILTIAZEM HCL 100 MG IV SOLR
5.0000 mg/h | INTRAVENOUS | Status: DC
  Administered 2017-03-18: 5 mg/h via INTRAVENOUS
  Administered 2017-03-18 – 2017-03-19 (×3): 10 mg/h via INTRAVENOUS
  Filled 2017-03-18 (×5): qty 100

## 2017-03-18 NOTE — H&P (Signed)
History and Physical    Tyler WITHEM Sawyer:[id number] DOB: 1940/08/03 DOA: 03/18/2017  PCP: Patient, No Pcp Per Patient coming from: gas station  Chief Complaint: syncope. Cocaine use  HPI: Tyler Sawyer is a 76 y.o. male with medical history significant of GERD.   Pt was released from jail 3 days ago after 41 days of incarceration. Patient states that after getting out of jail he almost immediately started using cocaine. He has used cocaine regularly since leaving jail . Last cocaine use was at 06:00 on 03/18/2017. Patient states he was up most of the night prior to admission with various people in his hotel room and is unsure of who always there with him. Patient states he awoke around noon in the room by himself. Patient left the hotel that point in time to go get some marked epigastric patient. Patient started eating a hotdog when he passed out. Patient is given different reports of this syncopal episode. Patient reported to EDP that he became lightheaded dizzy and then vomited and syncopized. To me patient states that he was simply sitting in a chair when he passed out. He does endorse having a sore neck and head which started sometime after his fall which he attributes to likely hitting his head on the ground when he fell. Patient does not think he was unconscious for very long and denies any loss of bowel or bladder function or tongue biting. EMS was called by gas station attendant to bring patient to [facility]. Patient denies any active chest pain, palpitations, fever, nausea, abdominal pain, dysuria, frequency, flank pain, focal neurological deficit. At time of examination patient has a heart rate in the 120-140 range and is in A. fib. Patient states he is completely a symptomatic to this.   ED Course: Objective findings outlined below. Patient placed on a diltiazem drip.  Review of Systems: As per HPI otherwise all other systems reviewed and are negative  Ambulatory Status:no  restrictions  Past Medical History:  Diagnosis Date  . GERD (gastroesophageal reflux disease)     History reviewed. No pertinent surgical history.  Social History   Social History  . Marital status: Single    Spouse name: N/A  . Number of children: N/A  . Years of education: N/A   Occupational History  . Not on file.   Social History Main Topics  . Smoking status: Current Every Day Smoker    Types: Cigarettes  . Smokeless tobacco: Never Used  . Alcohol use No  . Drug use: No  . Sexual activity: Not on file   Other Topics Concern  . Not on file   Social History Narrative  . No narrative on file    Allergies  Allergen Reactions  . Penicillins Anaphylaxis    Has patient had a PCN reaction causing immediate rash, facial/tongue/throat swelling, SOB or lightheadedness with hypotension: Yes Has patient had a PCN reaction causing severe rash involving mucus membranes or skin necrosis: Yes Has patient had a PCN reaction that required hospitalization No Has patient had a PCN reaction occurring within the last 10 years: No If all of the above answers are "NO", then may proceed with Cephalosporin use.     Family History  Problem Relation Age of Onset  . Diabetes Mother   . Diabetes Father       Prior to Admission medications   Not on File    Physical Exam: Vitals:   03/18/17 1500 03/18/17 1545 03/18/17 1600 03/18/17 1615  BP: 119/84 97/77 105/70 123/86  Pulse: 74 67 69   Resp: 12 16    Temp:      TempSrc:      SpO2: 93% 93% 93% (!) 88%     General:  Appears calm and comfortable ENT:  grossly normal hearing, lips & tongue, mmm Neck:  no LAD, masses or thyromegaly Cardiovascular: Faint heart sounds. Irregularly irregular. Tachycardia noted. 2+ peripheral pulses. Trace bilateral lower extremity pitting edema. Respiratory:  CTA bilaterally, no w/r/r. Normal respiratory effort. Abdomen:  soft, ntnd, NABS Skin:  no rash or induration seen on limited  exam Musculoskeletal:  grossly normal tone BUE/BLE, good ROM, no bony abnormality Psychiatric:  grossly normal mood and affect, speech fluent and appropriate, AOx3 Neurologic:  CN 2-12 grossly intact, moves all extremities in coordinated fashion, sensation intact  Labs on Admission: I have personally reviewed following labs and imaging studies  CBC:  Recent Labs Lab 03/18/17 1406  WBC 5.7  NEUTROABS 3.4  HGB 15.3  HCT 45.9  MCV 92.9  PLT 151   Basic Metabolic Panel:  Recent Labs Lab 03/18/17 1406  NA 140  K 4.9  CL 107  CO2 20*  GLUCOSE 112*  BUN 22*  CREATININE 2.28*  CALCIUM 8.8*   GFR: CrCl cannot be calculated (Unknown ideal weight.). Liver Function Tests:  Recent Labs Lab 03/18/17 1406  AST 32  ALT 21  ALKPHOS 80  BILITOT 1.3*  PROT 6.3*  ALBUMIN 3.5   No results for input(s): LIPASE, AMYLASE in the last 168 hours. No results for input(s): AMMONIA in the last 168 hours. Coagulation Profile: No results for input(s): INR, PROTIME in the last 168 hours. Cardiac Enzymes:  Recent Labs Lab 03/18/17 1406  TROPONINI <0.03   BNP (last 3 results) No results for input(s): PROBNP in the last 8760 hours. HbA1C: No results for input(s): HGBA1C in the last 72 hours. CBG: No results for input(s): GLUCAP in the last 168 hours. Lipid Profile: No results for input(s): CHOL, HDL, LDLCALC, TRIG, CHOLHDL, LDLDIRECT in the last 72 hours. Thyroid Function Tests: No results for input(s): TSH, T4TOTAL, FREET4, T3FREE, THYROIDAB in the last 72 hours. Anemia Panel: No results for input(s): VITAMINB12, FOLATE, FERRITIN, TIBC, IRON, RETICCTPCT in the last 72 hours. Urine analysis:    Component Value Date/Time   COLORURINE YELLOW 07/16/2015 1749   APPEARANCEUR CLEAR 07/16/2015 1749   LABSPEC 1.019 07/16/2015 1749   PHURINE 5.5 07/16/2015 1749   GLUCOSEU NEGATIVE 07/16/2015 1749   HGBUR TRACE (A) 07/16/2015 1749   BILIRUBINUR NEGATIVE 07/16/2015 1749   KETONESUR  NEGATIVE 07/16/2015 1749   PROTEINUR 100 (A) 07/16/2015 1749   NITRITE NEGATIVE 07/16/2015 1749   LEUKOCYTESUR NEGATIVE 07/16/2015 1749    Creatinine Clearance: CrCl cannot be calculated (Unknown ideal weight.).  Sepsis Labs: (procalcitonin:4,lacticidven:4) )No results found for this or any previous visit (from the past 240 hour(s)).   Radiological Exams on Admission: No results found.  EKG: Independently reviewed. Atrial flutter. New 4:1 AV block.   Assessment/Plan Active Problems:   New onset atrial flutter (HCC)   Atrial fibrillation with RVR (HCC)   Syncope and collapse   Musculoskeletal pain   Neck pain   Scalp pain   AKI (acute kidney injury) (HCC)   CKD (chronic kidney disease), stage III   Cocaine use   Hyperglycemia   New onset Aflutter w/ RVR w/ 4:1 block: likely secondary to 3 days of continual cocaine use. Previous EKG showing NSR w/o heart block. Troponin nml.  Asymptomatic other than syncopal episode outlined below. - Diltiazem drip - Telemetry - Cards consulted - Heparin - Echo  Syncope: likely related to arrhythmia, and cocaine use. No seizure type activity described, no source of infection identified, No evidence of CVA as neuro exam is non-focal.  - Tele, Echo - orthostatic VS  MSK pain: pain along the scalp of the back of the head and L and posterior neck pain. Suspect from when pt passed out and likely struck his head on the ground.  - DG cervical - CT head - tylenol - Ocycodone. prn   AoCKD: Cr 2.28. Baseline 1.7. Likely secondary to cocaine use, dehydration, and hypoperfusion from Afib and hypotension.  - IVF - BMP in am  Hyperglycemia: 112 on admission. Mild. fmhx of parents w/ DM - A1c  Cocaine Use: denies other illegal drug use. - UDS  Tobacco: smokes tobacco occasionally.  - nicotine gum prn   DVT prophylaxis: Heparin  Code Status: full  Family Communication: none  Disposition Plan: pending resolution of Afib/RVR and  syncope workup  Consults called: cardiology  Admission status: observation    Maxi Rodas J MD [facility]  If 7PM-7AM, please contact night-coverage [email or web address] Password [email or web address]  03/18/2017, 4:50 PM

## 2017-03-18 NOTE — Progress Notes (Signed)
ANTICOAGULATION CONSULT NOTE - Initial Consult  Pharmacy Consult for heparin Indication: atrial fibrillation  Allergies  Allergen Reactions  . Penicillins Anaphylaxis    Has patient had a PCN reaction causing immediate rash, facial/tongue/throat swelling, SOB or lightheadedness with hypotension: Yes Has patient had a PCN reaction causing severe rash involving mucus membranes or skin necrosis: Yes Has patient had a PCN reaction that required hospitalization No Has patient had a PCN reaction occurring within the last 10 years: No If all of the above answers are "NO", then may proceed with Cephalosporin use.     Patient Measurements: Height:  (175.3 cm) Weight: 240 lb 1.3 oz (108.9 kg) (per previous visit) IBW/kg (Calculated) : 70.7 Heparin Dosing Weight: 94.5kg  Vital Signs: Temp: 97.7 F (36.5 C) (09/19 1340) Temp Source: Oral (09/19 1340) BP: 123/86 (09/19 1615) Pulse Rate: 69 (09/19 1600)  Labs:  Recent Labs  03/18/17 1406  HGB 15.3  HCT 45.9  PLT 151  CREATININE 2.28*  TROPONINI <0.03    Estimated Creatinine Clearance: 34.6 mL/min (A) (by C-G formula based on SCr of 2.28 mg/dL (H)).   Medical History: Past Medical History:  Diagnosis Date  . GERD (gastroesophageal reflux disease)     Medications:  Infusions:  . sodium chloride    . diltiazem (CARDIZEM) infusion 10 mg/hr (03/18/17 1625)  . heparin      Assessment: 34 yom presented to the ED with syncope after cocaine use. Now in afib and starting IV heparin. Baseline CBC is WNL and he is not on anticoagulation PTA.   Goal of Therapy:  Heparin level 0.3-0.7 units/ml Monitor platelets by anticoagulation protocol: Yes   Plan:  Heparin bolus 4500 units IV x 1 Heparin gtt 1300 units/hr Check an 8 hr heparin level Daily heparin level and CBC  Hermenia Fritcher, Drake Leach 03/18/2017,5:12 PM

## 2017-03-18 NOTE — ED Provider Notes (Signed)
[facility]-EMERGENCY DEPT Provider Note   CSN: [id number] Arrival date & time: 03/18/17  1328     History   Chief Complaint Chief Complaint  Patient presents with  . Abnormal ECG  . Loss of Consciousness    HPI Tyler Sawyer is a 76 y.o. male.  HPI  The patient is a 76 year old male, recently incarcerated after 41 days was released 3 days ago. He states that when he got out of the jail he went straight to my cocaine which she has been using for the last 3 days. He last used at 6 AM this morning. He went to the gas station where he bought a hot dog and while he was eating it he felt acute onset of lightheadedness, dizziness vomit palpitations and shortness of breath. The symptoms are persistent, severe, nothing makes this better or worse. He denies any chest pain or swelling of the legs.  Past Medical History:  Diagnosis Date  . GERD (gastroesophageal reflux disease)     There are no active problems to display for this patient.   History reviewed. No pertinent surgical history.     Home Medications    Prior to Admission medications   Not on File    Family History No family history on file.  Social History Social History  Substance Use Topics  . Smoking status: Current Every Day Smoker    Types: Cigarettes  . Smokeless tobacco: Never Used  . Alcohol use No     Allergies   Penicillins   Review of Systems Review of Systems  All other systems reviewed and are negative.    Physical Exam Updated Vital Signs BP 92/78 (BP Location: Right Arm)   Pulse (!) 143   Temp 97.7 F (36.5 C) (Oral)   Resp 13   SpO2 96%   Physical Exam  Constitutional: He appears well-developed and well-nourished. No distress.  HENT:  Head: Normocephalic and atraumatic.  Mouth/Throat: Oropharynx is clear and moist. No oropharyngeal exudate.  Eyes: Pupils are equal, round, and reactive to light. Conjunctivae and EOM are normal. Right eye exhibits no discharge. Left eye exhibits no  discharge. No scleral icterus.  Neck: Normal range of motion. Neck supple. No JVD present. No thyromegaly present.  Cardiovascular: Normal heart sounds.  Exam reveals no gallop and no friction rub.   No murmur heard. Tachycardic, weak pulses at the radial arteries, regular rhythm at 145 bpm, no peripheral edema, no JVD  Pulmonary/Chest: Effort normal and breath sounds normal. No respiratory distress. He has no wheezes. He has no rales.  Abdominal: Soft. Bowel sounds are normal. He exhibits no distension and no mass. There is no tenderness.  Musculoskeletal: Normal range of motion. He exhibits no edema or tenderness.  Lymphadenopathy:    He has no cervical adenopathy.  Neurological: He is alert. Coordination normal.  Skin: Skin is warm and dry. No rash noted. No erythema.  Psychiatric: He has a normal mood and affect. His behavior is normal.  Nursing note and vitals reviewed.    ED Treatments / Results  Labs (all labs ordered are listed, but only abnormal results are displayed) Labs Reviewed  CBC WITH DIFFERENTIAL/PLATELET  COMPREHENSIVE METABOLIC PANEL  TROPONIN I  RAPID URINE DRUG SCREEN, HOSP PERFORMED    EKG  EKG Interpretation  Date/Time:  Wednesday March 18 2017 13:38:00 EDT Ventricular Rate:  143 PR Interval:    QRS Duration: 142 QT Interval:  349 QTC Calculation: 539 R Axis:   -62 Text Interpretation:  Ectopic atrial tachycardia, unifocal Nonspecific IVCD with LAD Since last tracing rate faster likely aflutter Confirmed by Eber Hong ([id number]) on 03/18/2017 2:01:06 PM       EKG Interpretation  Date/Time:  Wednesday March 18 2017 14:12:02 EDT Ventricular Rate:  74 PR Interval:    QRS Duration: 109 QT Interval:  506 QTC Calculation: 562 R Axis:   -55 Text Interpretation:  Atrial flutter with predominant 4:1 AV block Inferior infarct, recent Abnormal T, consider ischemia, anterior leads Prolonged QT interval Since last tracing rate has slowed - A flutter  persist Confirmed by Eber Hong ([id number]) on 03/18/2017 3:07:13 PM        Radiology No results found.  Procedures Procedures (including critical care time)  CRITICAL CARE Performed by: Vida Roller Total critical care time: 35 minutes Critical care time was exclusive of separately billable procedures and treating other patients. Critical care was necessary to treat or prevent imminent or life-threatening deterioration. Critical care was time spent personally by me on the following activities: development of treatment plan with patient and/or surrogate as well as nursing, discussions with consultants, evaluation of patient's response to treatment, examination of patient, obtaining history from patient or surrogate, ordering and performing treatments and interventions, ordering and review of laboratory studies, ordering and review of radiographic studies, pulse oximetry and re-evaluation of patient's condition.   Medications Ordered in ED Medications  diltiazem (CARDIZEM) 1 mg/mL load via infusion 20 mg (not administered)    And  diltiazem (CARDIZEM) 100 mg in dextrose 5 % 100 mL (1 mg/mL) infusion (not administered)     Initial Impression / Assessment and Plan / ED Course  I have reviewed the triage vital signs and the nursing notes.  Pertinent labs & imaging results that were available during my care of the patient were reviewed by me and considered in my medical decision making (see chart for details).   The patient has what appears to be atrial flutter with 21 block. This could be related to his underlying drug use, labs are been ordered, Cardizem has been ordered, the patient will be placed on cardiac monitoring. He is potentially critically ill with a significant cardiac arrhythmia.  The pt required cardizem and a drip t slow him down - he is currently on a cardizem drip   D/w Dr. Konrad Dolores - will admit to the hospital.     Final Clinical Impressions(s) / ED Diagnoses     Final diagnoses:  Atrial flutter with rapid ventricular response (HCC)  Cocaine abuse    New Prescriptions New Prescriptions   No medications on file     Eber Hong, MD 03/18/17 1545

## 2017-03-19 ENCOUNTER — Observation Stay (HOSPITAL_COMMUNITY): Payer: Medicare Other

## 2017-03-19 DIAGNOSIS — I480 Paroxysmal atrial fibrillation: Secondary | ICD-10-CM | POA: Diagnosis not present

## 2017-03-19 DIAGNOSIS — Z87892 Personal history of anaphylaxis: Secondary | ICD-10-CM | POA: Diagnosis not present

## 2017-03-19 DIAGNOSIS — F1490 Cocaine use, unspecified, uncomplicated: Secondary | ICD-10-CM | POA: Diagnosis not present

## 2017-03-19 DIAGNOSIS — Z720 Tobacco use: Secondary | ICD-10-CM | POA: Diagnosis not present

## 2017-03-19 DIAGNOSIS — M542 Cervicalgia: Secondary | ICD-10-CM

## 2017-03-19 DIAGNOSIS — N179 Acute kidney failure, unspecified: Secondary | ICD-10-CM | POA: Diagnosis not present

## 2017-03-19 DIAGNOSIS — I340 Nonrheumatic mitral (valve) insufficiency: Secondary | ICD-10-CM

## 2017-03-19 DIAGNOSIS — I129 Hypertensive chronic kidney disease with stage 1 through stage 4 chronic kidney disease, or unspecified chronic kidney disease: Secondary | ICD-10-CM | POA: Diagnosis not present

## 2017-03-19 DIAGNOSIS — N183 Chronic kidney disease, stage 3 (moderate): Secondary | ICD-10-CM | POA: Diagnosis not present

## 2017-03-19 DIAGNOSIS — M129 Arthropathy, unspecified: Secondary | ICD-10-CM | POA: Diagnosis not present

## 2017-03-19 DIAGNOSIS — R079 Chest pain, unspecified: Secondary | ICD-10-CM | POA: Diagnosis present

## 2017-03-19 DIAGNOSIS — R55 Syncope and collapse: Secondary | ICD-10-CM | POA: Diagnosis not present

## 2017-03-19 DIAGNOSIS — F1410 Cocaine abuse, uncomplicated: Secondary | ICD-10-CM

## 2017-03-19 DIAGNOSIS — I484 Atypical atrial flutter: Secondary | ICD-10-CM | POA: Diagnosis not present

## 2017-03-19 DIAGNOSIS — F17210 Nicotine dependence, cigarettes, uncomplicated: Secondary | ICD-10-CM | POA: Diagnosis not present

## 2017-03-19 DIAGNOSIS — Z833 Family history of diabetes mellitus: Secondary | ICD-10-CM | POA: Diagnosis not present

## 2017-03-19 DIAGNOSIS — R739 Hyperglycemia, unspecified: Secondary | ICD-10-CM | POA: Diagnosis not present

## 2017-03-19 DIAGNOSIS — I4891 Unspecified atrial fibrillation: Secondary | ICD-10-CM | POA: Diagnosis not present

## 2017-03-19 DIAGNOSIS — I4892 Unspecified atrial flutter: Secondary | ICD-10-CM | POA: Diagnosis not present

## 2017-03-19 DIAGNOSIS — F14129 Cocaine abuse with intoxication, unspecified: Secondary | ICD-10-CM | POA: Diagnosis not present

## 2017-03-19 DIAGNOSIS — Z880 Allergy status to penicillin: Secondary | ICD-10-CM | POA: Diagnosis not present

## 2017-03-19 LAB — BASIC METABOLIC PANEL
ANION GAP: 6 (ref 5–15)
BUN: 23 mg/dL — ABNORMAL HIGH (ref 6–20)
CHLORIDE: 109 mmol/L (ref 101–111)
CO2: 27 mmol/L (ref 22–32)
Calcium: 8.5 mg/dL — ABNORMAL LOW (ref 8.9–10.3)
Creatinine, Ser: 2 mg/dL — ABNORMAL HIGH (ref 0.61–1.24)
GFR calc non Af Amer: 31 mL/min — ABNORMAL LOW (ref >60)
GFR, EST AFRICAN AMERICAN: 36 mL/min — AB (ref >60)
Glucose, Bld: 106 mg/dL — ABNORMAL HIGH (ref 65–99)
POTASSIUM: 4 mmol/L (ref 3.5–5.1)
SODIUM: 142 mmol/L (ref 135–145)

## 2017-03-19 LAB — ECHOCARDIOGRAM COMPLETE
AOASC: 38 cm
CHL CUP TV REG PEAK VELOCITY: 239 cm/s
EWDT: 165 ms
FS: 20 % — AB (ref 28–44)
Height: 69 in
IVS/LV PW RATIO, ED: 0.91
LA vol: 77.7 mL
LADIAMINDEX: 1.91 cm/m2
LASIZE: 41 mm
LAVOLA4C: 66.7 mL
LAVOLIN: 36.3 mL/m2
LEFT ATRIUM END SYS DIAM: 41 mm
LV PW d: 11.6 mm — AB (ref 0.6–1.1)
LVOT area: 2.27 cm2
LVOTD: 17 mm
MV Dec: 165
MV Peak grad: 3 mmHg
MV pk E vel: 86.3 m/s
MVPKAVEL: 36.9 m/s
PISA EROA: 0.09 cm2
RV TAPSE: 15.8 mm
TRMAXVEL: 239 cm/s
VTI: 141 cm
Weight: 3246.4 oz

## 2017-03-19 LAB — TSH: TSH: 0.582 u[IU]/mL (ref 0.350–4.500)

## 2017-03-19 LAB — CBC
HCT: 45.4 % (ref 39.0–52.0)
HEMOGLOBIN: 15.1 g/dL (ref 13.0–17.0)
MCH: 30.8 pg (ref 26.0–34.0)
MCHC: 33.3 g/dL (ref 30.0–36.0)
MCV: 92.5 fL (ref 78.0–100.0)
Platelets: 164 10*3/uL (ref 150–400)
RBC: 4.91 MIL/uL (ref 4.22–5.81)
RDW: 14.7 % (ref 11.5–15.5)
WBC: 6.7 10*3/uL (ref 4.0–10.5)

## 2017-03-19 LAB — GLUCOSE, CAPILLARY: GLUCOSE-CAPILLARY: 101 mg/dL — AB (ref 65–99)

## 2017-03-19 LAB — HEPARIN LEVEL (UNFRACTIONATED): HEPARIN UNFRACTIONATED: 0.6 [IU]/mL (ref 0.30–0.70)

## 2017-03-19 LAB — CK: CK TOTAL: 346 U/L (ref 49–397)

## 2017-03-19 LAB — TROPONIN I

## 2017-03-19 LAB — MRSA PCR SCREENING: MRSA BY PCR: NEGATIVE

## 2017-03-19 MED ORDER — RIVAROXABAN (XARELTO) EDUCATION KIT FOR DVT/PE PATIENTS
PACK | Freq: Once | Status: DC
  Filled 2017-03-19: qty 1

## 2017-03-19 MED ORDER — DILTIAZEM HCL 60 MG PO TABS
60.0000 mg | ORAL_TABLET | Freq: Four times a day (QID) | ORAL | Status: DC
  Administered 2017-03-19: 60 mg via ORAL
  Filled 2017-03-19: qty 1

## 2017-03-19 MED ORDER — PERFLUTREN LIPID MICROSPHERE
1.0000 mL | INTRAVENOUS | Status: AC | PRN
  Administered 2017-03-19: 4 mL via INTRAVENOUS
  Filled 2017-03-19: qty 10

## 2017-03-19 MED ORDER — DILTIAZEM HCL 60 MG PO TABS
60.0000 mg | ORAL_TABLET | Freq: Four times a day (QID) | ORAL | Status: DC
  Administered 2017-03-19 – 2017-03-20 (×3): 60 mg via ORAL
  Filled 2017-03-19 (×3): qty 1

## 2017-03-19 MED ORDER — SODIUM CHLORIDE 0.9 % IV SOLN
INTRAVENOUS | Status: AC
  Administered 2017-03-19 – 2017-03-20 (×3): via INTRAVENOUS

## 2017-03-19 MED ORDER — RIVAROXABAN 15 MG PO TABS
15.0000 mg | ORAL_TABLET | Freq: Two times a day (BID) | ORAL | Status: DC
  Administered 2017-03-19: 15 mg via ORAL
  Filled 2017-03-19 (×2): qty 1

## 2017-03-19 NOTE — Plan of Care (Signed)
Problem: Pain Managment: Goal: General experience of comfort will improve Outcome: Completed/Met Date Met: 03/19/17 Patient denies pain

## 2017-03-19 NOTE — Plan of Care (Signed)
Problem: Safety: Goal: Ability to remain free from injury will improve Outcome: Progressing Call light and personal items within reach, pt encouraged to call staff for assistance.  Problem: Physical Regulation: Goal: Ability to maintain clinical measurements within normal limits will improve Outcome: Progressing VSS.  PO cardizem for A-flutter. Goal: Will remain free from infection Outcome: Progressing No s/s of infection noted.

## 2017-03-19 NOTE — Progress Notes (Signed)
  Echocardiogram 2D Echocardiogram has been performed.  Lissa Rowles G Carin Shipp 03/19/2017, 1:02 PM

## 2017-03-19 NOTE — Consult Note (Signed)
Cardiology Consultation:   Patient ID: Tyler Sawyer; [id number]; Jan 06, 1941   Admit date: 03/18/2017 Date of Consult: 03/19/2017  Primary Care Provider: Patient, No Pcp Per Primary Cardiologist: New to Dr. Anne Fu   Patient Profile:   Tyler Sawyer is a 76 y.o. male with a hx of no significant past medical history except daily cocaine abuse who is being seen today for the evaluation of atrial fibrillation at the request of Dr. Waymon Amato.   History of Present Illness:   Mr. Kornegay presented with syncope. He was released from jail 3 days ago. He was eating a hot dog yesterday while had syncope episode. Brought to ER by EMS. He was up all night at [facility]. Used cocaine. Denies IV drug use. No exertional chest pain or shortness of breath. He does loading and unloading of trucks. No orthopnea, PND, dizziness or melena. Patient endorsed to having palpitation after using cocaine but no chest pain or shortness of breath. Patient denies any family history of CAD or stroke.  In ER, EKG shows flutter at rate of 143 bpm with ST depression in inferior leads - personally reviewed.patient was started on IV Cardizem. He was spontaneously noted overnight. Intermittent A. Fib flutter since then on review of telemetry. Currently in sinus rhythm at rate of 60s to 70s. Echo done pending reading --> preliminary review showed an apical thrombus and low EF by myself. CK normal.  Troponin negative. TSH normal. Creatinine improved today. 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   Past Medical History:  Diagnosis Date  . GERD (gastroesophageal reflux disease)     History reviewed. No pertinent surgical history.    Inpatient Medications: Scheduled Meds: . sodium chloride flush  3 mL Intravenous Q12H   Continuous Infusions: . sodium chloride    . diltiazem (CARDIZEM) infusion 10 mg/hr (03/19/17 0540)  . heparin 1,300 Units/hr (03/19/17 0540)   PRN Meds: acetaminophen **OR** acetaminophen, ondansetron **OR** ondansetron  (ZOFRAN) IV, oxyCODONE  Allergies:    Allergies  Allergen Reactions  . Penicillins Anaphylaxis    Has patient had a PCN reaction causing immediate rash, facial/tongue/throat swelling, SOB or lightheadedness with hypotension: Yes Has patient had a PCN reaction causing severe rash involving mucus membranes or skin necrosis: Yes Has patient had a PCN reaction that required hospitalization No Has patient had a PCN reaction occurring within the last 10 years: No If all of the above answers are "NO", then may proceed with Cephalosporin use.     Social History:   Social History   Social History  . Marital status: Single    Spouse name: N/A  . Number of children: N/A  . Years of education: N/A   Occupational History  . Not on file.   Social History Main Topics  . Smoking status: Current Every Day Smoker    Types: Cigarettes  . Smokeless tobacco: Never Used  . Alcohol use No  . Drug use: No  . Sexual activity: Not on file   Other Topics Concern  . Not on file   Social History Narrative  . No narrative on file    Family History:    Family History  Problem Relation Age of Onset  . Diabetes Mother   . Diabetes Father      ROS:  Please see the history of present illness.  ROS All other ROS reviewed and negative.     Physical Exam/Data:   Vitals:   03/18/17 2352 03/19/17 0450 03/19/17 0741 03/19/17 1108  BP: 107/82  99/81 (!) 116/91 112/87  Pulse: 61 75 (!) 55 73  Resp: Temp: 98.5 F (36.9 C) (!) 97.5 F (36.4 C) 97.6 F (36.4 C) 98.1 F (36.7 C)  TempSrc: Oral Oral Oral Oral  SpO2: 92% 96% 95% 96%  Weight:      Height:        Intake/Output Summary (Last 24 hours) at 03/19/17 1256 Last data filed at 03/19/17 1100  Gross per 24 hour  Intake           1119.9 ml  Output              100 ml  Net           1019.9 ml   Filed Weights   03/18/17 1700 03/18/17 2046  Weight: 240 lb 1.3 oz (108.9 kg) 202 lb 14.4 oz (92 kg)   Body mass index is 29.96  kg/m.  General:  Well nourished, well developed, in no acute distress HEENT: normal Lymph: no adenopathy Neck: no JVD Endocrine:  No thryomegaly Vascular: No carotid bruits; FA pulses 2+ bilaterally without bruits  Cardiac:  normal S1, S2; RRR; no murmur  Lungs:  clear to auscultation bilaterally, no wheezing, rhonchi or rales  Abd: soft, nontender, no hepatomegaly  Ext: no edema Musculoskeletal:  No deformities, BUE and BLE strength normal and equal Skin: warm and dry  Neuro:  CNs 2-12 intact, no focal abnormalities noted Psych:  Normal affect   Relevant CV Studies: Echo done pending reading  Laboratory Data:  Chemistry Recent Labs Lab 03/18/17 1406 03/19/17 0446  NA 140 142  K 4.9 4.0  CL 107 109  CO2 20* 27  GLUCOSE 112* 106*  BUN 22* 23*  CREATININE 2.28* 2.00*  CALCIUM 8.8* 8.5*  GFRNONAA 27* 31*  GFRAA 31* 36*  ANIONGAP 13 6     Recent Labs Lab 03/18/17 1406  PROT 6.3*  ALBUMIN 3.5  AST 32  ALT 21  ALKPHOS 80  BILITOT 1.3*   Hematology Recent Labs Lab 03/18/17 1406 03/19/17 0446  WBC 5.7 6.7  RBC 4.94 4.91  HGB 15.3 15.1  HCT 45.9 45.4  MCV 92.9 92.5  MCH 31.0 30.8  MCHC 33.3 33.3  RDW 14.5 14.7  PLT 151 164   Cardiac Enzymes Recent Labs Lab 03/18/17 1406 03/18/17 1852 03/18/17 2201 03/19/17 0446  TROPONINI <0.03 <0.03 <0.03 <0.03   Radiology/Studies:  Dg Cervical Spine 2 Or 3 Views  Result Date: 03/18/2017 CLINICAL DATA:  Cervicalgia EXAM: CERVICAL SPINE - 2-3 VIEW COMPARISON:  None. FINDINGS: Frontal, lateral, and open-mouth odontoid images were obtained. There is no demonstrable fracture. There is 3 mm of retrolisthesis of C4 on C5. There is 2 mm of anterolisthesis of C5 on C6. No other spondylolisthesis is evident. Prevertebral soft tissues and predental space regions are normal. There is severe disc space narrowing at C3-4 and C4-5. There is moderate disc space narrowing at C6-7. There are anterior osteophytes at C3, C4, C5, C6,  and C7. No erosive change. Lung apices are clear. IMPRESSION: Multilevel arthropathy. Areas of mild spondylolisthesis are felt to be due to the underlying spondylosis. No acute fracture evident. Electronically Signed   By: Bretta Bang III M.D.   On: 03/18/2017 18:09   Ct Head Wo Contrast  Result Date: 03/18/2017 CLINICAL DATA:  Headache, syncopal episode, fall, injury EXAM: CT HEAD WITHOUT CONTRAST TECHNIQUE: Contiguous axial images were obtained from the base of the skull through the vertex without intravenous  contrast. COMPARISON:  None available FINDINGS: Brain: Extensive chronic white matter microvascular changes throughout both cerebral hemispheres. No acute intracranial hemorrhage, new infarction, mass lesion, midline shift, herniation, hydrocephalus, or extra-axial fluid collection. Cisterns are patent. No cerebellar abnormality. Vascular: No hyperdense vessel or unexpected calcification. Skull: Normal. Negative for fracture or focal lesion. Sinuses/Orbits: No acute finding. Other: None. IMPRESSION: Chronic white matter microvascular ischemic changes. No acute intracranial abnormality by noncontrast CT. Electronically Signed   By: Judie Petit.  Shick M.D.   On: 03/18/2017 19:48    Assessment and Plan:   1. Paroxysmal atrial Flutter - Converted to sinus rhythm overnight on IV Cardizem 10 mg/h. Intermittent small burst of A. Fib. EKG showed Aflutter at rate of 73 bpm. CHADVASC score of 2 (AGE and possible CHF). Continue IV heparin for anticoagulation. Stop IV cardizem. Start Cardizem  q 6 hours. Change to long acting tomorrow.  - Patient endorsed to having palpitation following use of cocaine.  2. Syncope - Likely arrhythmia induced however could be orthostatic hypotension versus vagovagal. No driving for 6 months.  3. Acute on presumed chronic kidney disease stage III - Creatinine improved overnight. Per primary team  4. Cocaine and tobacco abuse - Advised smoking sensation. Education  given. Patient states that "I have stopped this admission code".   5. Apical thrombus? - Pending reading of echocardiogram. Preliminary review concern for apical thrombus with reduced LV function. 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 Patient denies any exertional chest pain or shortness of breath. He will need anticoagulation. Concern regarding compliance.   For questions or updates, please contact [facility] Please consult [email or web address] for contact info under Cardiology/STEMI.   Lorelei Pont, PA  03/19/2017 12:56 PM   Personally seen and examined. Agree with above.  Currently resting comfortably, no chest pain, shortness of breath  Heart currently regular rate and rhythm with occasional ectopy (currently in atrial flutter with 4:1 conduction) Lungs are clear, abdomen soft, no edema  EKG personally reviewed shows atrial flutter with variable conduction  Atrial flutter, atypical  - Given his score of at least 2, he deserves anticoagulation. Recommend Xarelto for ease of use.  - Agree with transitioning over to by mouth Cardizem. His heart rate is currently well controlled.  Questionable apical thrombus  - This may be artifactual and we will have our echo technician return and perform a contrast study.  Cocaine abuse  - He understands the mortality risks with cocaine.  We will follow.  Donato Schultz, MD

## 2017-03-19 NOTE — Progress Notes (Addendum)
PROGRESS NOTE   Tyler Sawyer  ZOX:[id number]    DOB: 05/19/1941    DOA: 03/18/2017  PCP: Patient, No Pcp Per   I have briefly reviewed patients previous medical [facility] in [facility].  Brief Narrative:  76 year old male with PMH of GERD, tobacco and cocaine abuse, recently released from jail 3 days PTA after being incarcerated for 41 days, using cocaine daily since release, presented to the ED after passing out at a gas station and attendant there called EMS. In ED, noted to be in A. fib with RVR in the 120s-140s. Also reported head and neck being sore from suspected fall. Echo and Cardiology input pending.   Assessment & Plan:   Active Problems:   New onset atrial flutter (HCC)   Atrial fibrillation with RVR (HCC)   Syncope and collapse   Musculoskeletal pain   Neck pain   Scalp pain   AKI (acute kidney injury) (HCC)   CKD (chronic kidney disease), stage III   Cocaine use   Hyperglycemia   1. Syncope: Differential diagnosis: Arrhythmias, orthostatic hypotension versus others. Presented with atrial flutter with RVR. Continue monitoring on telemetry. Check orthostatic blood pressures. Follow-up 2-D echo. CT head without acute findings. Patient advised that he should not drive for at least 6 months and he verbalized understanding. Troponin 3: Negative. 2. New onset atrial flutter with RVR: Most likely precipitated by ongoing cocaine abuse. Check TSH and 2-D echo. Currently on IV Cardizem 10 mg/h and heparin infusion. Variable rate but controlled, 3:1, 4:1 block. Cardiology was consulted on admission and their input is pending. 3. Neck pain: Likely related to fall and direct trauma. CT head without acute findings. X-ray of cervical spine shows multilevel arthropathy but no acute fracture. Will get CT cervical spine without contrast to further evaluate. Symptomatic treatment with analgesics. 4. Acute on stage III chronic kidney disease: Last creatinine on 08/03/16:1.75. Presented with  creatinine of 2.28. Possibly due to dehydration. Improved with IV fluids to creatinine 2. Continue IV fluids for additional 24 hours and follow up BMP. CK 346 5. Tobacco abuse: Cessation counseled. 6. Cocaine abuse: Extensively counseled regarding cessation. 7. Glucose intolerance: A1c 6.3. Outpatient follow-up.   DVT prophylaxis: Currently on IV heparin infusion Code Status: Full Family Communication: None at bedside Disposition: DC home possibly in the next 24 hours.   Consultants:  Cardiology-pending   Procedures:  None  Antimicrobials:  None    Subjective: Overall feels better. Upset because he hasn't gotten his breakfast yet this morning. Reports neck pain but better compared to admission. No headache. Denies dizziness, lightheadedness, palpitations, dyspnea or chest pain. No asymmetrical limb weakness, numbness or tingling. No visual symptoms reported.  ROS: As above  Objective:  Vitals:   03/18/17 2145 03/18/17 2352 03/19/17 0450 03/19/17 0741  BP: 116/76 107/82 99/81 (!) 116/91  Pulse: 61 61 75 (!) 55  Resp: (!) Temp: 97.6 F (36.4 C) 98.5 F (36.9 C) (!) 97.5 F (36.4 C) 97.6 F (36.4 C)  TempSrc: Oral Oral Oral Oral  SpO2: 93% 92% 96% 95%  Weight:      Height:        Examination:  General exam:Pleasant elderly male, moderately built and nourished, lying comfortably propped up in bed. Oral mucosa mildly dry. Respiratory system: Clear to auscultation. Respiratory effort normal. Cardiovascular system: S1 & S2 heard, RRR. No JVD, murmurs, rubs, gallops or clicks. No pedal edema. telemetry: Atrial flutter with 3 in 1, 4-in-1 block.  Gastrointestinal system: Abdomen is nondistended, soft and nontender. No organomegaly or masses felt. Normal bowel sounds heard. Central nervous system: Alert and oriented. No focal neurological deficits. Extremities: Symmetric 5 x 5 power. Skin: No rashes, lesions or ulcers Psychiatry: Judgement and insight appear  normal. Mood & affect appropriate. Musculoskeletal: Mild cervical tenderness without deformity.      Data Reviewed: I have personally reviewed following labs and imaging studies  CBC:  Recent Labs Lab 03/18/17 1406 03/19/17 0446  WBC 5.7 6.7  NEUTROABS 3.4  --   HGB 15.3 15.1  HCT 45.9 45.4  MCV 92.9 92.5  PLT 151 164   Basic Metabolic Panel:  Recent Labs Lab 03/18/17 1406 03/18/17 1852 03/19/17 0446  NA 140  --  142  K 4.9  --  4.0  CL 107  --  109  CO2 20*  --  27  GLUCOSE 112*  --  106*  BUN 22*  --  23*  CREATININE 2.28*  --  2.00*  CALCIUM 8.8*  --  8.5*  MG  --  1.9  --   PHOS  --  3.4  --    Liver Function Tests:  Recent Labs Lab 03/18/17 1406  AST 32  ALT 21  ALKPHOS 80  BILITOT 1.3*  PROT 6.3*  ALBUMIN 3.5   Cardiac Enzymes:  Recent Labs Lab 03/18/17 1406 03/18/17 1852 03/18/17 2201 03/19/17 0446 03/19/17 0747  CKTOTAL  --   --   --   --  346  TROPONINI <0.03 <0.03 <0.03 <0.03  --    HbA1C:  Recent Labs  03/18/17 1852  HGBA1C 6.3*   CBG:  Recent Labs Lab 03/19/17 0450  GLUCAP 101*    Recent Results (from the past 240 hour(s))  MRSA PCR Screening     Status: None   Collection Time: 03/19/17  1:40 AM  Result Value Ref Range Status   MRSA by PCR NEGATIVE NEGATIVE Final    Comment:        The GeneXpert MRSA Assay (FDA approved for NASAL specimens only), is one component of a comprehensive MRSA colonization surveillance program. It is not intended to diagnose MRSA infection nor to guide or monitor treatment for MRSA infections.          Radiology Studies: Dg Cervical Spine 2 Or 3 Views  Result Date: 03/18/2017 CLINICAL DATA:  Cervicalgia EXAM: CERVICAL SPINE - 2-3 VIEW COMPARISON:  None. FINDINGS: Frontal, lateral, and open-mouth odontoid images were obtained. There is no demonstrable fracture. There is 3 mm of retrolisthesis of C4 on C5. There is 2 mm of anterolisthesis of C5 on C6. No other spondylolisthesis  is evident. Prevertebral soft tissues and predental space regions are normal. There is severe disc space narrowing at C3-4 and C4-5. There is moderate disc space narrowing at C6-7. There are anterior osteophytes at C3, C4, C5, C6, and C7. No erosive change. Lung apices are clear. IMPRESSION: Multilevel arthropathy. Areas of mild spondylolisthesis are felt to be due to the underlying spondylosis. No acute fracture evident. Electronically Signed   By: Bretta Bang III M.D.   On: 03/18/2017 18:09   Ct Head Wo Contrast  Result Date: 03/18/2017 CLINICAL DATA:  Headache, syncopal episode, fall, injury EXAM: CT HEAD WITHOUT CONTRAST TECHNIQUE: Contiguous axial images were obtained from the base of the skull through the vertex without intravenous contrast. COMPARISON:  None available FINDINGS: Brain: Extensive chronic white matter microvascular changes throughout both cerebral hemispheres. No acute intracranial hemorrhage, new infarction,  mass lesion, midline shift, herniation, hydrocephalus, or extra-axial fluid collection. Cisterns are patent. No cerebellar abnormality. Vascular: No hyperdense vessel or unexpected calcification. Skull: Normal. Negative for fracture or focal lesion. Sinuses/Orbits: No acute finding. Other: None. IMPRESSION: Chronic white matter microvascular ischemic changes. No acute intracranial abnormality by noncontrast CT. Electronically Signed   By: Judie Petit.  Shick M.D.   On: 03/18/2017 19:48        Scheduled Meds: . sodium chloride flush  3 mL Intravenous Q12H   Continuous Infusions: . sodium chloride 75 mL/hr at 03/19/17 0540  . diltiazem (CARDIZEM) infusion 10 mg/hr (03/19/17 0540)  . heparin 1,300 Units/hr (03/19/17 0540)     LOS: 0 days     Falisha Osment, MD, FACP, FHM. [facility] Pager [phone]  If 7PM-7AM, please contact night-coverage [email or web address] Password [facility] 03/19/2017, 10:54 AM

## 2017-03-19 NOTE — Progress Notes (Signed)
ANTICOAGULATION CONSULT NOTE Pharmacy Consult for heparin Indication: atrial fibrillation  Allergies  Allergen Reactions  . Penicillins Anaphylaxis    Has patient had a PCN reaction causing immediate rash, facial/tongue/throat swelling, SOB or lightheadedness with hypotension: Yes Has patient had a PCN reaction causing severe rash involving mucus membranes or skin necrosis: Yes Has patient had a PCN reaction that required hospitalization No Has patient had a PCN reaction occurring within the last 10 years: No If all of the above answers are "NO", then may proceed with Cephalosporin use.     Patient Measurements: Height:  (175.3 cm) Weight: 202 lb 14.4 oz (92 kg) IBW/kg (Calculated) : 70.7 Heparin Dosing Weight: 94.5kg  Vital Signs: Temp: 98.1 F (36.7 C) (09/20 1108) Temp Source: Oral (09/20 1108) BP: 132/100 (09/20 1426) Pulse Rate: 73 (09/20 1108)  Labs:  Recent Labs  03/18/17 1406 03/18/17 1852 03/18/17 2201 03/19/17 0305 03/19/17 0446 03/19/17 0747  HGB 15.3  --   --   --  15.1  --   HCT 45.9  --   --   --  45.4  --   PLT 151  --   --   --  164  --   HEPARINUNFRC  --   --   --  0.60  --   --   CREATININE 2.28*  --   --   --  2.00*  --   CKTOTAL  --   --   --   --   --  346  TROPONINI <0.03 <0.03 <0.03  --  <0.03  --     Estimated Creatinine Clearance: 36.3 mL/min (A) (by C-G formula based on SCr of 2 mg/dL (H)).  Assessment: .76 y.o. male with chest pain after cocaine use.  He had an echo with preliminary results indicating an apical thrombus.  We have been asked to initiate rivaroxaban.  Renal function is improved with estimated crcl of 64ml/min.  His H/H and platelets are all stable and no noted bleeding complications.  He also presented with Afib.   Plan:  Rivaroxaban  bid x 21 days then  daily Discontinue IV heparin Monitor CBC/bleeding issues  Nadara Mustard, PharmD., MS Clinical Pharmacist Pager:  [phone] Thank you for allowing  pharmacy to be part of this patients care team. 03/19/2017,3:14 PM

## 2017-03-19 NOTE — Progress Notes (Signed)
ANTICOAGULATION CONSULT NOTE Pharmacy Consult for heparin Indication: atrial fibrillation  Allergies  Allergen Reactions  . Penicillins Anaphylaxis    Has patient had a PCN reaction causing immediate rash, facial/tongue/throat swelling, SOB or lightheadedness with hypotension: Yes Has patient had a PCN reaction causing severe rash involving mucus membranes or skin necrosis: Yes Has patient had a PCN reaction that required hospitalization No Has patient had a PCN reaction occurring within the last 10 years: No If all of the above answers are "NO", then may proceed with Cephalosporin use.     Patient Measurements: Height:  (175.3 cm) Weight: 202 lb 14.4 oz (92 kg) IBW/kg (Calculated) : 70.7 Heparin Dosing Weight: 94.5kg  Vital Signs: Temp: 98.5 F (36.9 C) (09/19 2352) Temp Source: Oral (09/19 2352) BP: 107/82 (09/19 2352) Pulse Rate: 61 (09/19 2352)  Labs:  Recent Labs  03/18/17 1406 03/18/17 1852 03/18/17 2201 03/19/17 0305  HGB 15.3  --   --   --   HCT 45.9  --   --   --   PLT 151  --   --   --   HEPARINUNFRC  --   --   --  0.60  CREATININE 2.28*  --   --   --   TROPONINI <0.03 <0.03 <0.03  --     Estimated Creatinine Clearance: 31.8 mL/min (A) (by C-G formula based on SCr of 2.28 mg/dL (H)).  Assessment: .76 y.o. male with chest pain for heparin   Goal of Therapy:  Heparin level 0.3-0.7 units/ml Monitor platelets by anticoagulation protocol: Yes   Plan:  Continue Heparin at current rate   Zyniah Ferraiolo, Gary Fleet 03/19/2017,4:16 AM

## 2017-03-20 LAB — CBC
HEMATOCRIT: 45.2 % (ref 39.0–52.0)
HEMOGLOBIN: 15.3 g/dL (ref 13.0–17.0)
MCH: 31 pg (ref 26.0–34.0)
MCHC: 33.8 g/dL (ref 30.0–36.0)
MCV: 91.5 fL (ref 78.0–100.0)
Platelets: 162 10*3/uL (ref 150–400)
RBC: 4.94 MIL/uL (ref 4.22–5.81)
RDW: 14.5 % (ref 11.5–15.5)
WBC: 6.1 10*3/uL (ref 4.0–10.5)

## 2017-03-20 LAB — BASIC METABOLIC PANEL
ANION GAP: 8 (ref 5–15)
BUN: 17 mg/dL (ref 6–20)
CHLORIDE: 112 mmol/L — AB (ref 101–111)
CO2: 22 mmol/L (ref 22–32)
Calcium: 8.6 mg/dL — ABNORMAL LOW (ref 8.9–10.3)
Creatinine, Ser: 1.76 mg/dL — ABNORMAL HIGH (ref 0.61–1.24)
GFR calc non Af Amer: 36 mL/min — ABNORMAL LOW (ref >60)
GFR, EST AFRICAN AMERICAN: 42 mL/min — AB (ref >60)
Glucose, Bld: 101 mg/dL — ABNORMAL HIGH (ref 65–99)
POTASSIUM: 3.8 mmol/L (ref 3.5–5.1)
Sodium: 142 mmol/L (ref 135–145)

## 2017-03-20 MED ORDER — DILTIAZEM HCL ER COATED BEADS 240 MG PO CP24
240.0000 mg | ORAL_CAPSULE | Freq: Every day | ORAL | Status: DC
  Administered 2017-03-20 – 2017-03-21 (×2): 240 mg via ORAL
  Filled 2017-03-20 (×2): qty 1

## 2017-03-20 MED ORDER — RIVAROXABAN 15 MG PO TABS
15.0000 mg | ORAL_TABLET | Freq: Every day | ORAL | Status: DC
  Administered 2017-03-20: 15 mg via ORAL
  Filled 2017-03-20: qty 1

## 2017-03-20 NOTE — Progress Notes (Addendum)
PROGRESS NOTE    Tyler Sawyer  ZOX:[id number] DOB: Nov 01, 1940 DOA: 03/18/2017 PCP: Patient, No Pcp Per    Brief Narrative:  76 year old male who presented with syncope episode. Patient experienced a syncope episode, while sitting, prodromal lightheadedness, emesis and dizziness. Patient reports using cocaine lately. On his initial physical examination blood pressure 119/84, heart rate 74, respiratory rate 16, oxygen saturation 93%. Moist mucous membranes, lungs were clear to auscultation bilaterally, heart S1-S2 present, irregularly irregular, tachycardic, abdomen soft nontender, lower extremities with trace pitting edema. EKG initial atrial tachycardia and then atrial flutter.   Patient admitted to the hospital with the working diagnosis of new onset atrial flutter complicated by syncope, in the setting of cocaine intoxication.   Assessment & Plan:   Active Problems:   Chest pain   New onset atrial flutter (HCC)   Atrial fibrillation with RVR (HCC)   Syncope and collapse   Musculoskeletal pain   Neck pain   Scalp pain   AKI (acute kidney injury) (HCC)   CKD (chronic kidney disease), stage III   Cocaine use   Hyperglycemia   1. New onset atrial flutter. Patient tolerating well po cardizem short acting, will change to long acting formulation and will continue telemetry monitoring, out of bed as tolerated, will continue anticoagulation with rivaroxaban, will continue to follow cardiology recommendations. If continue good toleration will plan to discharge in am. Social services consulted to arrange outpatient rivaroxaban.   2. Cocaine intoxication. No tremors, or signs of withdrawal.   3. Hyperglycemia. Serum glucose 101, patient tolerating po well.   4. AKI on CKD stage 3. Stable renal function, will follow on renal panel in am, target K at 4 and Mg at 2.   5. Tobacco abuse. Smoking cessation    DVT prophylaxis: rivaroxavban  Code Status:  full Family Communication:    Disposition Plan:    Consultants:   Cardiology   Procedures:     Antimicrobials:       Subjective: No further chest pain or palpitation, no pnd or orthopnea. Persistent neck pain, no radiation, worse with movement, no nausea or vomiting.   Objective: Vitals:   03/19/17 2000 03/19/17 2016 03/20/17 0006 03/20/17 0336  BP: (!) 148/96 (!) 148/96 138/85 (!) 131/93  Pulse:   67 90  Resp: Temp:   97.8 F (36.6 C) 97.6 F (36.4 C)  TempSrc:   Oral Oral  SpO2:   95% 98%  Weight:    91.7 kg (202 lb 3.2 oz)  Height:        Intake/Output Summary (Last 24 hours) at 03/20/17 0838 Last data filed at 03/20/17 8119  Gross per 24 hour  Intake          3362.51 ml  Output             2450 ml  Net           912.51 ml   Filed Weights   03/18/17 1700 03/18/17 2046 03/20/17 0336  Weight: 108.9 kg (240 lb 1.3 oz) 92 kg (202 lb 14.4 oz) 91.7 kg (202 lb 3.2 oz)    Examination:  General: Not in pain or dyspnea Neurology: Awake and alert, non focal  E ENT: no pallor, no icterus, oral mucosa moist Cardiovascular: No JVD. S1-S2 present, irregular, no gallops, rubs, or murmurs. No lower extremity edema. Pulmonary: vesicular breath sounds bilaterally, adequate air movement, no wheezing, rhonchi or rales. Gastrointestinal. Abdomen flat, no organomegaly, non tender, no  rebound or guarding Skin. No rashes Musculoskeletal: no joint deformities     Data Reviewed: I have personally reviewed following labs and imaging studies  CBC:  Recent Labs Lab 03/18/17 1406 03/19/17 0446 03/20/17 0353  WBC 5.7 6.7 6.1  NEUTROABS 3.4  --   --   HGB 15.3 15.1 15.3  HCT 45.9 45.4 45.2  MCV 92.9 92.5 91.5  PLT 151 164 162   Basic Metabolic Panel:  Recent Labs Lab 03/18/17 1406 03/18/17 1852 03/19/17 0446 03/20/17 0353  NA 140  --  142 142  K 4.9  --  4.0 3.8  CL 107  --  109 112*  CO2 20*  --  27 22  GLUCOSE 112*  --  106* 101*  BUN 22*  --  23* 17  CREATININE 2.28*  --   2.00* 1.76*  CALCIUM 8.8*  --  8.5* 8.6*  MG  --  1.9  --   --   PHOS  --  3.4  --   --    GFR: Estimated Creatinine Clearance: 41.2 mL/min (A) (by C-G formula based on SCr of 1.76 mg/dL (H)). Liver Function Tests:  Recent Labs Lab 03/18/17 1406  AST 32  ALT 21  ALKPHOS 80  BILITOT 1.3*  PROT 6.3*  ALBUMIN 3.5   No results for input(s): LIPASE, AMYLASE in the last 168 hours. No results for input(s): AMMONIA in the last 168 hours. Coagulation Profile: No results for input(s): INR, PROTIME in the last 168 hours. Cardiac Enzymes:  Recent Labs Lab 03/18/17 1406 03/18/17 1852 03/18/17 2201 03/19/17 0446 03/19/17 0747  CKTOTAL  --   --   --   --  346  TROPONINI <0.03 <0.03 <0.03 <0.03  --    BNP (last 3 results) No results for input(s): PROBNP in the last 8760 hours. HbA1C:  Recent Labs  03/18/17 1852  HGBA1C 6.3*   CBG:  Recent Labs Lab 03/19/17 0450  GLUCAP 101*   Lipid Profile: No results for input(s): CHOL, HDL, LDLCALC, TRIG, CHOLHDL, LDLDIRECT in the last 72 hours. Thyroid Function Tests:  Recent Labs  03/18/17 1852 03/19/17 1138  TSH 0.513 0.582  FREET4 0.79  --    Anemia Panel: No results for input(s): VITAMINB12, FOLATE, FERRITIN, TIBC, IRON, RETICCTPCT in the last 72 hours.    Radiology Studies: I have reviewed all of the imaging during this hospital visit personally     Scheduled Meds: . diltiazem  60 mg Oral Q6H  . rivaroxaban  15 mg Oral Q supper  . sodium chloride flush  3 mL Intravenous Q12H   Continuous Infusions:   LOS: 1 day        [name], MD [facility] Pager [phone]

## 2017-03-20 NOTE — Progress Notes (Signed)
ANTICOAGULATION CONSULT NOTE Pharmacy Consult for xarelto Indication: atrial fibrillation  Allergies  Allergen Reactions  . Penicillins Anaphylaxis    Has patient had a PCN reaction causing immediate rash, facial/tongue/throat swelling, SOB or lightheadedness with hypotension: Yes Has patient had a PCN reaction causing severe rash involving mucus membranes or skin necrosis: Yes Has patient had a PCN reaction that required hospitalization No Has patient had a PCN reaction occurring within the last 10 years: No If all of the above answers are "NO", then may proceed with Cephalosporin use.     Patient Measurements: Height:  (175.3 cm) Weight: 202 lb 3.2 oz (91.7 kg) IBW/kg (Calculated) : 70.7 Heparin Dosing Weight: 94.5kg  Vital Signs: Temp: 97.6 F (36.4 C) (09/21 0336) Temp Source: Oral (09/21 0336) BP: 156/110 (09/21 0838) Pulse Rate: 90 (09/21 0336)  Labs:  Recent Labs  03/18/17 1406 03/18/17 1852 03/18/17 2201 03/19/17 0305 03/19/17 0446 03/19/17 0747 03/20/17 0353  HGB 15.3  --   --   --  15.1  --  15.3  HCT 45.9  --   --   --  45.4  --  45.2  PLT 151  --   --   --  164  --  162  HEPARINUNFRC  --   --   --  0.60  --   --   --   CREATININE 2.28*  --   --   --  2.00*  --  1.76*  CKTOTAL  --   --   --   --   --  346  --   TROPONINI <0.03 <0.03 <0.03  --  <0.03  --   --     Estimated Creatinine Clearance: 41.2 mL/min (A) (by C-G formula based on SCr of 1.76 mg/dL (H)).  Assessment: .76 y.o. male with chest pain after cocaine use.  He is noted with afib and concern of apical thrombus and pharmacy consulted to dose Xarelto. With apical thrombus coumadin may be a better option. I spoke with Dr. Anne Fu and the apical thrombus is likely artifact. Plans are to proceed with Xarelto.   -Xarelto  po bid started 9/20. Will need to be changed -SCr= 1.76 (baseline ~ 1.5-1.9), CrCl ~ 45-50   Plan:  -Change Xarelto to  po daily -Will provide patient  education  Harland German, Pharm D 03/20/2017 8:43 AM

## 2017-03-20 NOTE — Progress Notes (Signed)
Progress Note  Patient Name: Tyler Sawyer Date of Encounter: 03/20/2017  Primary Cardiologist: Anne Fu new  Subjective   No complaints, no CP, no SOB  Inpatient Medications    Scheduled Meds: . diltiazem  240 mg Oral Daily  . rivaroxaban  15 mg Oral Q supper   Continuous Infusions:  PRN Meds: acetaminophen **OR** acetaminophen, ondansetron **OR** ondansetron (ZOFRAN) IV, oxyCODONE   Vital Signs    Vitals:   03/19/17 2016 03/20/17 0006 03/20/17 0336 03/20/17 0838  BP: (!) 148/96 138/85 (!) 131/93 (!) 156/110  Pulse:  67 90   Resp:  17 18   Temp:  97.8 F (36.6 C) 97.6 F (36.4 C) 97.9 F (36.6 C)  TempSrc:  Oral Oral Oral  SpO2:  95% 98%   Weight:   202 lb 3.2 oz (91.7 kg)   Height:        Intake/Output Summary (Last 24 hours) at 03/20/17 1008 Last data filed at 03/20/17 1610  Gross per 24 hour  Intake          3362.51 ml  Output             2450 ml  Net           912.51 ml   Filed Weights   03/18/17 1700 03/18/17 2046 03/20/17 0336  Weight: 240 lb 1.3 oz (108.9 kg) 202 lb 14.4 oz (92 kg) 202 lb 3.2 oz (91.7 kg)    Telemetry    AFib rate cont - Personally Reviewed  ECG    AFIB - Personally Reviewed  Physical Exam   GEN: No acute distress.   Neck: No JVD Cardiac: Irreg irreg, no murmurs, rubs, or gallops.  Respiratory: Clear to auscultation bilaterally. GI: Soft, nontender, non-distended  MS: No edema; No deformity. Neuro:  Nonfocal  Psych: Normal affect   Labs    Chemistry Recent Labs Lab 03/18/17 1406 03/19/17 0446 03/20/17 0353  NA 140 142 142  K 4.9 4.0 3.8  CL 107 109 112*  CO2 20* 27 22  GLUCOSE 112* 106* 101*  BUN 22* 23* 17  CREATININE 2.28* 2.00* 1.76*  CALCIUM 8.8* 8.5* 8.6*  PROT 6.3*  --   --   ALBUMIN 3.5  --   --   AST 32  --   --   ALT 21  --   --   ALKPHOS 80  --   --   BILITOT 1.3*  --   --   GFRNONAA 27* 31* 36*  GFRAA 31* 36* 42*  ANIONGAP Hematology Recent Labs Lab 03/18/17 1406  03/19/17 0446 03/20/17 0353  WBC 5.7 6.7 6.1  RBC 4.94 4.91 4.94  HGB 15.3 15.1 15.3  HCT 45.9 45.4 45.2  MCV 92.9 92.5 91.5  MCH 31.0 30.8 31.0  MCHC 33.3 33.3 33.8  RDW 14.5 14.7 14.5  PLT 151 164 162    Cardiac Enzymes Recent Labs Lab 03/18/17 1406 03/18/17 1852 03/18/17 2201 03/19/17 0446  TROPONINI <0.03 <0.03 <0.03 <0.03   No results for input(s): TROPIPOC in the last 168 hours.   BNPNo results for input(s): BNP, PROBNP in the last 168 hours.   DDimer No results for input(s): DDIMER in the last 168 hours.   Radiology    Dg Cervical Spine 2 Or 3 Views  Result Date: 03/18/2017 CLINICAL DATA:  Cervicalgia EXAM: CERVICAL SPINE - 2-3 VIEW COMPARISON:  None. FINDINGS: Frontal, lateral, and open-mouth odontoid images were obtained. There  is no demonstrable fracture. There is 3 mm of retrolisthesis of C4 on C5. There is 2 mm of anterolisthesis of C5 on C6. No other spondylolisthesis is evident. Prevertebral soft tissues and predental space regions are normal. There is severe disc space narrowing at C3-4 and C4-5. There is moderate disc space narrowing at C6-7. There are anterior osteophytes at C3, C4, C5, C6, and C7. No erosive change. Lung apices are clear. IMPRESSION: Multilevel arthropathy. Areas of mild spondylolisthesis are felt to be due to the underlying spondylosis. No acute fracture evident. Electronically Signed   By: Bretta Bang III M.D.   On: 03/18/2017 18:09   Ct Head Wo Contrast  Result Date: 03/18/2017 CLINICAL DATA:  Headache, syncopal episode, fall, injury EXAM: CT HEAD WITHOUT CONTRAST TECHNIQUE: Contiguous axial images were obtained from the base of the skull through the vertex without intravenous contrast. COMPARISON:  None available FINDINGS: Brain: Extensive chronic white matter microvascular changes throughout both cerebral hemispheres. No acute intracranial hemorrhage, new infarction, mass lesion, midline shift, herniation, hydrocephalus, or extra-axial  fluid collection. Cisterns are patent. No cerebellar abnormality. Vascular: No hyperdense vessel or unexpected calcification. Skull: Normal. Negative for fracture or focal lesion. Sinuses/Orbits: No acute finding. Other: None. IMPRESSION: Chronic white matter microvascular ischemic changes. No acute intracranial abnormality by noncontrast CT. Electronically Signed   By: Judie Petit.  Shick M.D.   On: 03/18/2017 19:48    Cardiac Studies   ECHO 45%, no thrombus  Patient Profile     76 y.o. male with cocaine use, AFIB, mildly reduced EF  Assessment & Plan    PAF  - cocaine cessation  - Xarelto  - Dilt for rate control  - avoid Bb  Cocaine  - cessation  Syncope  - no further bouts. ? Etiology  - No pauses on tele  - no driving 6 months  Essential HTN  - per main team  Will sign off. Please call if >?  For questions or updates, please contact [facility] Please consult [email or web address] for contact info under Cardiology/STEMI.      Signed, Donato Schultz, MD  03/20/2017, 10:08 AM

## 2017-03-20 NOTE — Care Management Note (Signed)
Case Management Note  Patient Details  Name: Tyler Sawyer MRN: [id number] Date of Birth: 1941-01-28  Subjective/Objective:   Pt Presented for Syncope and Cocaine use. PTA pt was independent- plan to return home in [place], [place]. Pt states he has no PCP/ Pharmacy-Pt has Medicare and Medicaid. Xarelto Benefits Check Completed and Co pay will be $1.00. Per pt he never takes medications-Unsure if he will get filled. CM did provide pt with 30 day free Xarelto Card.                   Action/Plan: Pt will need Rx for 30 day free and no refills and then the original with the refills. CM did reach out to [facility] [address] in [place] and the Xarelto is available.  Health Connect Number provided to patient for PCP to be established. Pt states he wants a PCP in [place].  Expected Discharge Date:                  Expected Discharge Plan:  Home/Self Care  In-House Referral:  NA  Discharge planning Services  CM Consult, Medication Assistance  Post Acute Care Choice:  NA Choice offered to:  NA  DME Arranged:  N/A DME Agency:  NA  HH Arranged:  NA HH Agency:  NA  Status of Service:  Completed, signed off  If discussed at Long Length of Stay Meetings, dates discussed:    Additional Comments:  Gala Lewandowsky, RN 03/20/2017, 12:31 PM

## 2017-03-21 DIAGNOSIS — I4892 Unspecified atrial flutter: Secondary | ICD-10-CM

## 2017-03-21 MED ORDER — DILTIAZEM HCL ER COATED BEADS 240 MG PO CP24: 240.0000 mg | ORAL_CAPSULE | Freq: Every day | ORAL | 1 refills | Status: AC

## 2017-03-21 MED ORDER — RIVAROXABAN 15 MG PO TABS: 15.0000 mg | ORAL_TABLET | Freq: Every day | ORAL | 0 refills | Status: AC

## 2017-03-21 NOTE — Discharge Summary (Signed)
Physician Discharge Summary  RALPHEAL ZAPPONE ZOX:[id number] DOB: [birth year] DOA: 03/18/2017  PCP: Patient, No Pcp Per  Admit date: 03/18/2017 Discharge date: 03/21/2017  Recommendations for Outpatient Follow-up:  Continue Cardizem 240 mg daily Continue Xarelto 15 mg daily  Discharge Diagnoses:  Active Problems:   Chest pain   New onset atrial flutter (HCC)   Atrial fibrillation with RVR (HCC)   Syncope and collapse   Musculoskeletal pain   Neck pain   Scalp pain   AKI (acute kidney injury) (HCC)   CKD (chronic kidney disease), stage III   Cocaine use   Hyperglycemia    Discharge Condition: stable   Diet recommendation: as tolerated   History of present illness:   Per brief narrative 40/53 "76 year old male who presented with syncope episode. Patient experienced a syncope episode, while sitting, prodromal lightheadedness, emesis and dizziness. Patient reports using cocaine lately. On his initial physical examination blood pressure 119/84, heart rate 74, respiratory rate 16, oxygen saturation 93%. Moist mucous membranes, lungs were clear to auscultation bilaterally, heart S1-S2 present, irregularly irregular, tachycardic, abdomen soft nontender, lower extremities with trace pitting edema. EKG initial atrial tachycardia and then atrial flutter.  Patient admitted to the hospital with the working diagnosis of new onset atrial flutter complicated by syncope, in the setting of cocaine intoxication."  Hospital Course:   Active Problems:   New onset atrial flutter (HCC) - Per cardiology, continue Cardizem, avoid beta blocker in the setting of cocaine use - Continue xarelto 15 mg daily     CKD (chronic kidney disease), stage III - Monitor on outpatient basis    Cocaine use - Counseled on cessation     Signed:  Manson Passey, MD  [facility] 03/21/2017, 9:00 AM  Pager #: [phone]  Time spent in minutes: more than 30 minutes  Procedures:  None    Consultations:  Cardio   Discharge Exam: Vitals:   03/21/17 0400 03/21/17 0609  BP:  (!) 149/90  Pulse:  81  Resp: 19 18  Temp:  97.6 F (36.4 C)  SpO2:  92%   Vitals:   03/20/17 1612 03/20/17 2051 03/21/17 0400 03/21/17 0609  BP: (!) 126/108 (!) 133/95  (!) 149/90  Pulse: 90 (!) 59  81  Resp:  Temp: 98.2 F (36.8 C) 98.2 F (36.8 C)  97.6 F (36.4 C)  TempSrc: Oral Oral  Oral  SpO2:  96%  92%  Weight:    90.6 kg (199 lb 11.2 oz)  Height:        General: Pt is alert, follows commands appropriately, not in acute distress Cardiovascular: Regular rate and rhythm, S1/S2 +, no murmurs Respiratory: Clear to auscultation bilaterally, no wheezing, no crackles, no rhonchi Abdominal: Soft, non tender, non distended, bowel sounds +, no guarding Extremities: no edema, no cyanosis, pulses palpable bilaterally DP and PT Neuro: Grossly nonfocal  Discharge Instructions  Discharge Instructions    Call MD for:  difficulty breathing, headache or visual disturbances    Complete by:  As directed    Call MD for:  redness, tenderness, or signs of infection (pain, swelling, redness, odor or green/yellow discharge around incision site)    Complete by:  As directed    Call MD for:  severe uncontrolled pain    Complete by:  As directed    Diet - low sodium heart healthy    Complete by:  As directed    Discharge instructions    Complete by:  As directed  Continue Cardizem 240 mg daily Continue Xarelto 15 mg daily   Increase activity slowly    Complete by:  As directed      Allergies as of 03/21/2017      Reactions   Penicillins Anaphylaxis   Has patient had a PCN reaction causing immediate rash, facial/tongue/throat swelling, SOB or lightheadedness with hypotension: Yes Has patient had a PCN reaction causing severe rash involving mucus membranes or skin necrosis: Yes Has patient had a PCN reaction that required hospitalization No Has patient had a PCN reaction  occurring within the last 10 years: No If all of the above answers are "NO", then may proceed with Cephalosporin use.      Medication List    TAKE these medications   diltiazem 240 MG 24 hr capsule Commonly known as:  CARDIZEM CD Take 1 capsule (240 mg total) by mouth daily.   Rivaroxaban 15 MG Tabs tablet Commonly known as:  XARELTO Take 1 tablet (15 mg total) by mouth daily with supper.            Discharge Care Instructions        Start     Ordered   03/21/17 0000  diltiazem (CARDIZEM CD) 240 MG 24 hr capsule  Daily     03/21/17 0859   03/21/17 0000  Rivaroxaban (XARELTO) 15 MG TABS tablet  Daily with supper     03/21/17 0859   03/21/17 0000  Increase activity slowly     03/21/17 0859   03/21/17 0000  Diet - low sodium heart healthy     03/21/17 0859   03/21/17 0000  Discharge instructions    Comments:  Continue Cardizem 240 mg daily Continue Xarelto 15 mg daily   03/21/17 0859   03/21/17 0000  Call MD for:  severe uncontrolled pain     03/21/17 0859   03/21/17 0000  Call MD for:  redness, tenderness, or signs of infection (pain, swelling, redness, odor or green/yellow discharge around incision site)     03/21/17 0859   03/21/17 0000  Call MD for:  difficulty breathing, headache or visual disturbances     03/21/17 0859        The results of significant diagnostics from this hospitalization (including imaging, microbiology, ancillary and laboratory) are listed below for reference.    Significant Diagnostic Studies: Dg Cervical Spine 2 Or 3 Views  Result Date: 03/18/2017 CLINICAL DATA:  Cervicalgia EXAM: CERVICAL SPINE - 2-3 VIEW COMPARISON:  None. FINDINGS: Frontal, lateral, and open-mouth odontoid images were obtained. There is no demonstrable fracture. There is 3 mm of retrolisthesis of C4 on C5. There is 2 mm of anterolisthesis of C5 on C6. No other spondylolisthesis is evident. Prevertebral soft tissues and predental space regions are normal. There is severe  disc space narrowing at C3-4 and C4-5. There is moderate disc space narrowing at C6-7. There are anterior osteophytes at C3, C4, C5, C6, and C7. No erosive change. Lung apices are clear. IMPRESSION: Multilevel arthropathy. Areas of mild spondylolisthesis are felt to be due to the underlying spondylosis. No acute fracture evident. Electronically Signed   By: Bretta Bang III M.D.   On: 03/18/2017 18:09   Ct Head Wo Contrast  Result Date: 03/18/2017 CLINICAL DATA:  Headache, syncopal episode, fall, injury EXAM: CT HEAD WITHOUT CONTRAST TECHNIQUE: Contiguous axial images were obtained from the base of the skull through the vertex without intravenous contrast. COMPARISON:  None available FINDINGS: Brain: Extensive chronic white matter microvascular changes throughout  both cerebral hemispheres. No acute intracranial hemorrhage, new infarction, mass lesion, midline shift, herniation, hydrocephalus, or extra-axial fluid collection. Cisterns are patent. No cerebellar abnormality. Vascular: No hyperdense vessel or unexpected calcification. Skull: Normal. Negative for fracture or focal lesion. Sinuses/Orbits: No acute finding. Other: None. IMPRESSION: Chronic white matter microvascular ischemic changes. No acute intracranial abnormality by noncontrast CT. Electronically Signed   By: Judie Petit.  Shick M.D.   On: 03/18/2017 19:48    Microbiology: Recent Results (from the past 240 hour(s))  MRSA PCR Screening     Status: None   Collection Time: 03/19/17  1:40 AM  Result Value Ref Range Status   MRSA by PCR NEGATIVE NEGATIVE Final    Comment:        The GeneXpert MRSA Assay (FDA approved for NASAL specimens only), is one component of a comprehensive MRSA colonization surveillance program. It is not intended to diagnose MRSA infection nor to guide or monitor treatment for MRSA infections.      Labs: Basic Metabolic Panel:  Recent Labs Lab 03/18/17 1406 03/18/17 1852 03/19/17 0446 03/20/17 0353  NA  140  --  142 142  K 4.9  --  4.0 3.8  CL 107  --  109 112*  CO2 20*  --  27 22  GLUCOSE 112*  --  106* 101*  BUN 22*  --  23* 17  CREATININE 2.28*  --  2.00* 1.76*  CALCIUM 8.8*  --  8.5* 8.6*  MG  --  1.9  --   --   PHOS  --  3.4  --   --    Liver Function Tests:  Recent Labs Lab 03/18/17 1406  AST 32  ALT 21  ALKPHOS 80  BILITOT 1.3*  PROT 6.3*  ALBUMIN 3.5   No results for input(s): LIPASE, AMYLASE in the last 168 hours. No results for input(s): AMMONIA in the last 168 hours. CBC:  Recent Labs Lab 03/18/17 1406 03/19/17 0446 03/20/17 0353  WBC 5.7 6.7 6.1  NEUTROABS 3.4  --   --   HGB 15.3 15.1 15.3  HCT 45.9 45.4 45.2  MCV 92.9 92.5 91.5  PLT 151 164 162   Cardiac Enzymes:  Recent Labs Lab 03/18/17 1406 03/18/17 1852 03/18/17 2201 03/19/17 0446 03/19/17 0747  CKTOTAL  --   --   --   --  346  TROPONINI <0.03 <0.03 <0.03 <0.03  --    BNP: BNP (last 3 results) No results for input(s): BNP in the last 8760 hours.  ProBNP (last 3 results) No results for input(s): PROBNP in the last 8760 hours.  CBG:  Recent Labs Lab 03/19/17 0450  GLUCAP 101*

## 2017-03-21 NOTE — Plan of Care (Signed)
Problem: Safety: Goal: Ability to remain free from injury will improve Outcome: Progressing Fall risk bundle in place. No injuries or skin breakdown this shift. Will continue to monitor and assess.   Problem: Pain Managment: Goal: General experience of comfort will improve Outcome: Progressing No complaints of pain this shift. Will continue to monitor.

## 2017-06-19 DIAGNOSIS — F1910 Other psychoactive substance abuse, uncomplicated: Secondary | ICD-10-CM | POA: Insufficient documentation

## 2017-10-06 DIAGNOSIS — F17200 Nicotine dependence, unspecified, uncomplicated: Secondary | ICD-10-CM | POA: Insufficient documentation

## 2018-08-30 IMAGING — CT CT HEAD W/O CM
3 series · 15 of 47 positions shown, 18 images · non-contrast
Comparison: None available

CLINICAL DATA: Headache, syncopal episode, fall, injury

EXAM:
CT HEAD WITHOUT CONTRAST
TECHNIQUE: Contiguous axial images were obtained from the base of the skull
through the vertex without intravenous contrast.

[Series 3: head 5.0 h30s · axial · 0.48mm/px · z∈[-109,+21]mm · 9 of 32 slices shown, 12 images]
[im 3/32  brain]
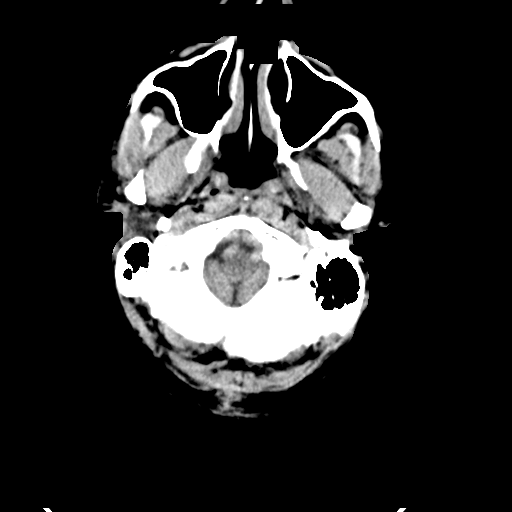
[im 3/32  bone]
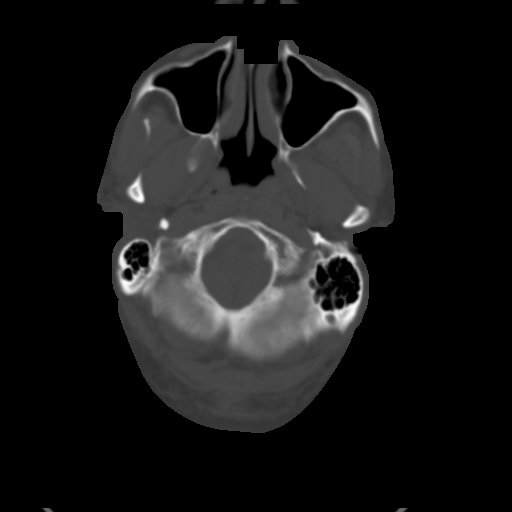
[im 6/32  brain]
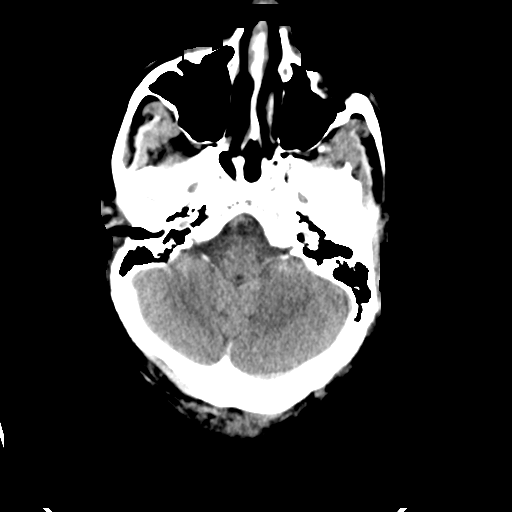
[im 9/32  brain]
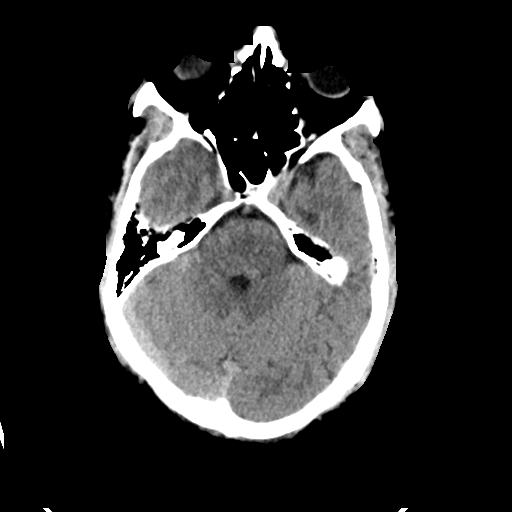
[im 12/32  brain]
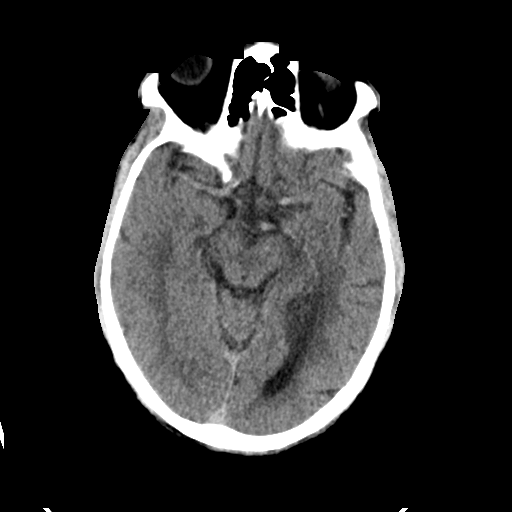
[im 17/32  brain]
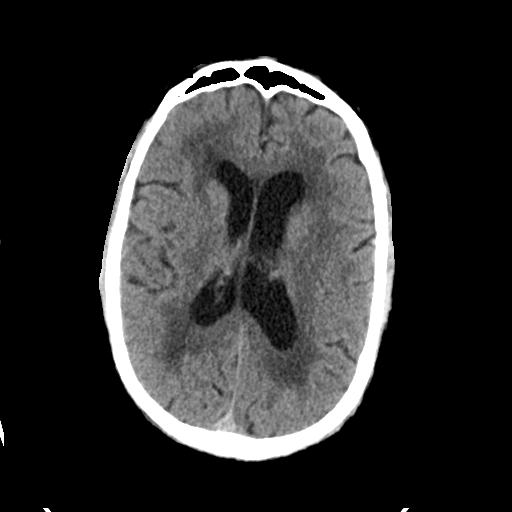
[im 17/32  bone]
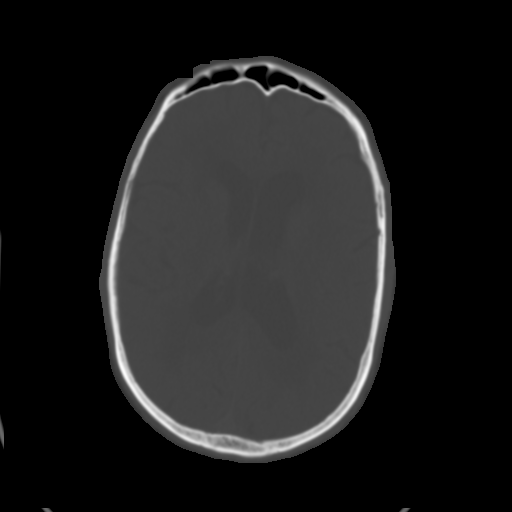
[im 20/32  brain]
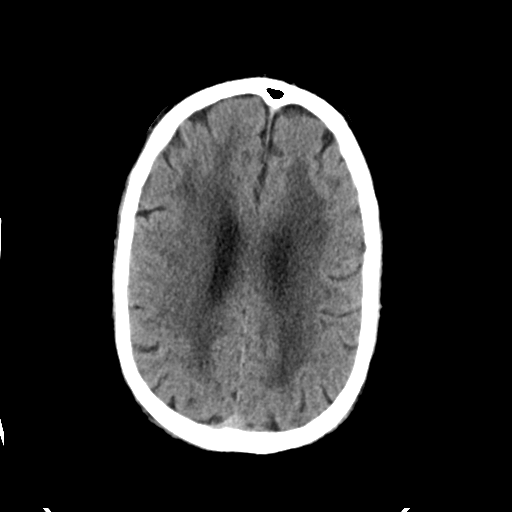
[im 23/32  brain]
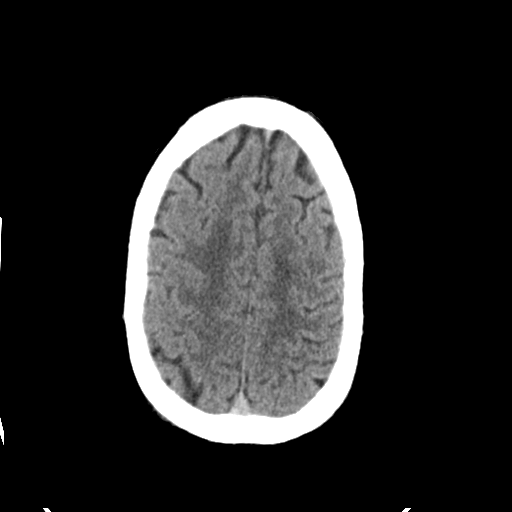
[im 26/32  brain]
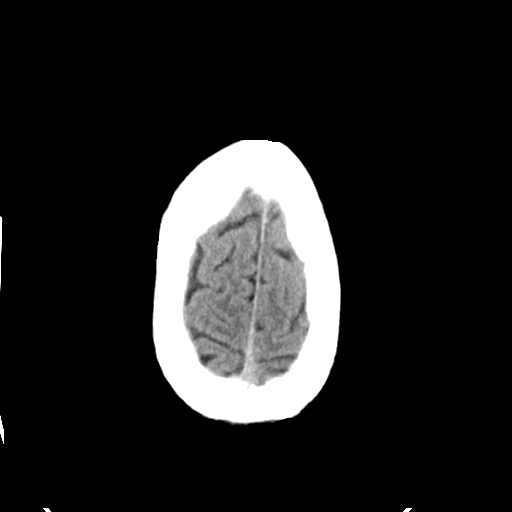
[im 29/32  brain]
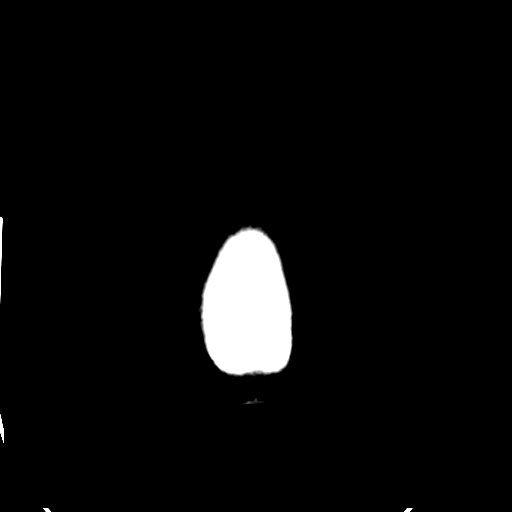
[im 29/32  bone]
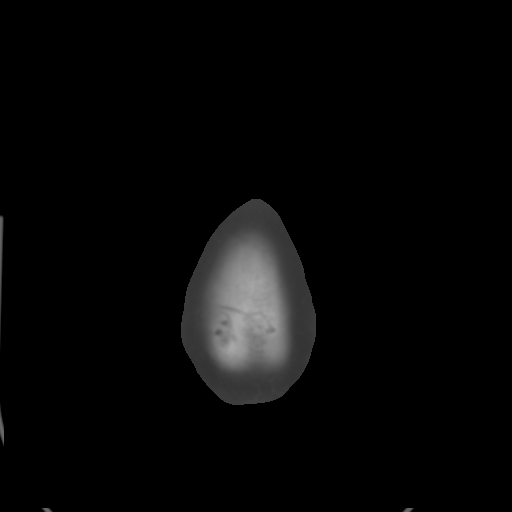

[Series 5: head 3.0 mpr cor · coronal · 0.31mm/px · 3 of 77 slices shown]
[im 26/77  brain]
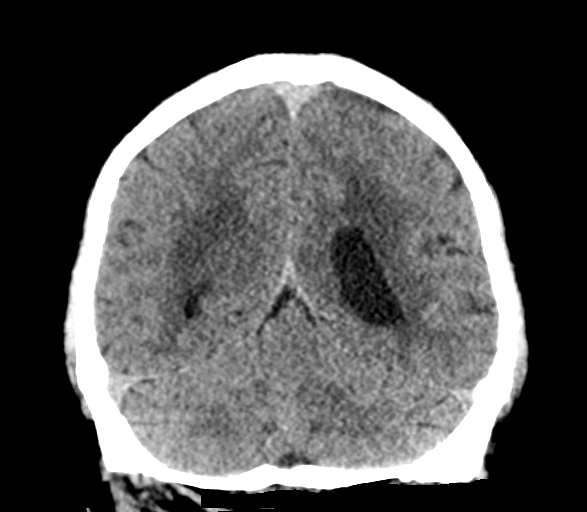
[im 34/77  brain]
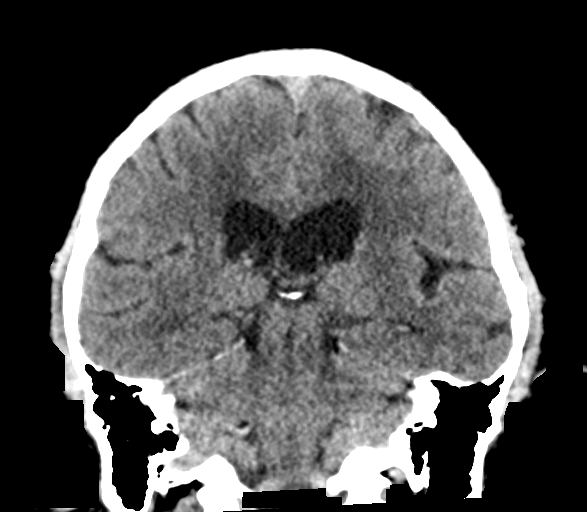
[im 43/77  brain]
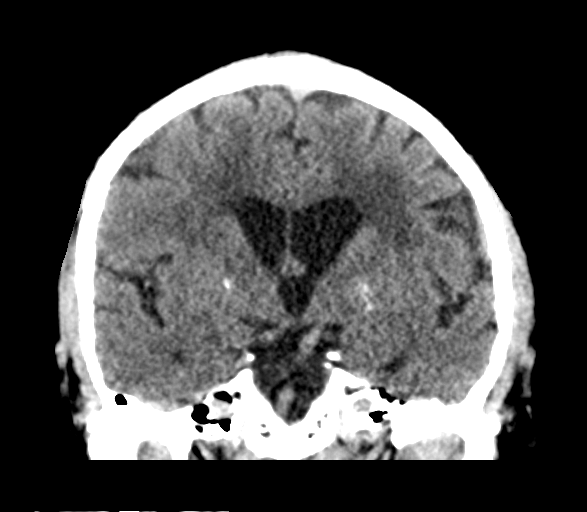

[Series 6: head 3.0 mpr sag · sagittal · 0.31mm/px · 3 of 59 slices shown]
[im 20/59  brain]
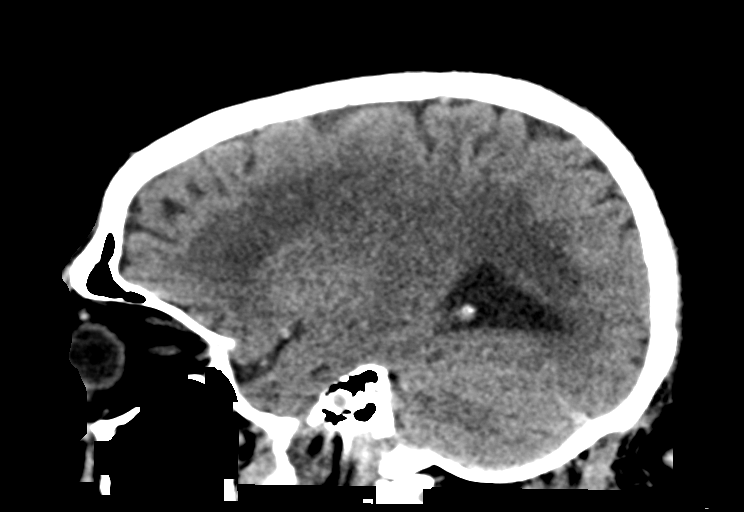
[im 30/59  brain]
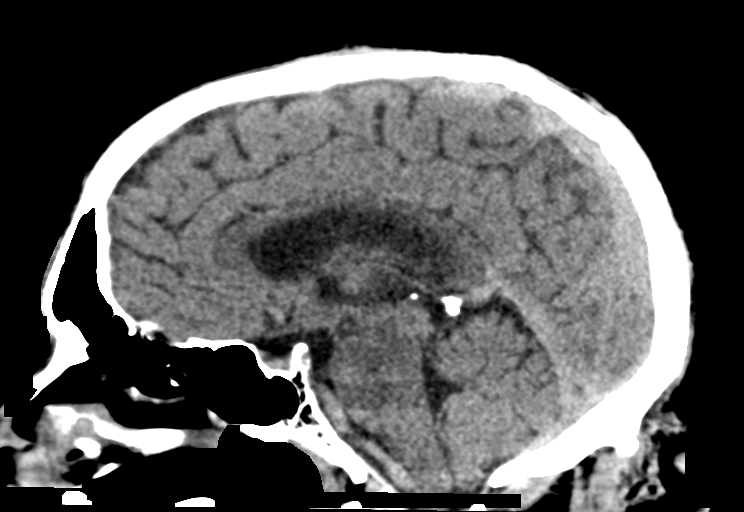
[im 39/59  brain]
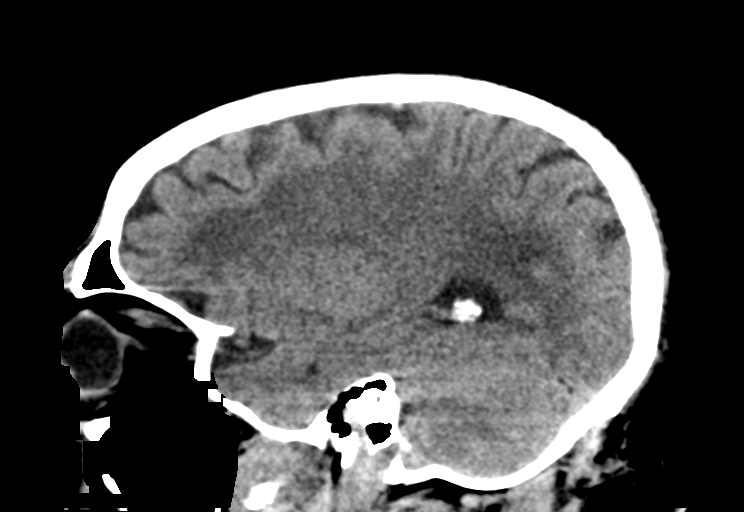

[15 of 47 positions shown; findings below may reference images not displayed]

FINDINGS: Brain: Extensive chronic white matter microvascular changes
throughout both cerebral hemispheres. No acute intracranial
hemorrhage, new infarction, mass lesion, midline shift, herniation,
hydrocephalus, or extra-axial fluid collection. Cisterns are patent.
No cerebellar abnormality.

Vascular: No hyperdense vessel or unexpected calcification.

Skull: Normal. Negative for fracture or focal lesion.

Sinuses/Orbits: No acute finding.

Other: None.
IMPRESSION: Chronic white matter microvascular ischemic changes.

No acute intracranial abnormality by noncontrast CT.

## 2019-12-05 DIAGNOSIS — K219 Gastro-esophageal reflux disease without esophagitis: Secondary | ICD-10-CM | POA: Insufficient documentation

## 2019-12-05 DIAGNOSIS — F1410 Cocaine abuse, uncomplicated: Secondary | ICD-10-CM | POA: Insufficient documentation

## 2019-12-27 ENCOUNTER — Ambulatory Visit (INDEPENDENT_AMBULATORY_CARE_PROVIDER_SITE_OTHER): Payer: Medicare PPO | Admitting: Podiatry

## 2019-12-27 ENCOUNTER — Other Ambulatory Visit: Payer: Self-pay

## 2019-12-27 ENCOUNTER — Other Ambulatory Visit: Payer: Self-pay | Admitting: Podiatry

## 2019-12-27 DIAGNOSIS — M79676 Pain in unspecified toe(s): Secondary | ICD-10-CM

## 2019-12-27 DIAGNOSIS — D689 Coagulation defect, unspecified: Secondary | ICD-10-CM | POA: Diagnosis not present

## 2019-12-27 DIAGNOSIS — M79609 Pain in unspecified limb: Secondary | ICD-10-CM

## 2019-12-27 DIAGNOSIS — M792 Neuralgia and neuritis, unspecified: Secondary | ICD-10-CM

## 2019-12-27 DIAGNOSIS — M79672 Pain in left foot: Secondary | ICD-10-CM

## 2019-12-27 DIAGNOSIS — M79671 Pain in right foot: Secondary | ICD-10-CM

## 2019-12-27 DIAGNOSIS — B351 Tinea unguium: Secondary | ICD-10-CM

## 2019-12-27 NOTE — Progress Notes (Signed)
°  Subjective:  Patient ID: Tyler Sawyer, male    DOB: 1940/12/06,  MRN: [id number]  Chief Complaint  Patient presents with   Foot Pain    BL toes (1-10) burning x 6-7 mo; 10/10 sharp burning senstation -pt denie sinjury -wrose at night -w/ numbness and tinlging Tx: none    debride    BL nail trimming -nails sore and long     79 y.o. male presents with the above complaint. History confirmed with patient. Endorses hx of slipped discs in his back.  Objective:  Physical Exam: warm, good capillary refill, nail exam onychomycosis of the toenails, no trophic changes or ulcerative lesions. DP pulses palpable, PT pulses palpable and protective sensation intact Left Foot: normal exam, no swelling, tenderness, instability; ligaments intact, full range of motion of all ankle/foot joints  Right Foot: normal exam, no swelling, tenderness, instability; ligaments intact, full range of motion of all ankle/foot joints   Assessment:   1. Pain due to onychomycosis of nail   2. Coagulation defect (HCC)   3. Neuralgia and neuritis      Plan:  Patient was evaluated and treated and all questions answered.  Onychomycosis and Coagulation Defect -Nails palliatively debrided secondary to pain  Procedure: Nail Debridement Rationale: Patient meets criteria for routine foot care due to coag defect Type of Debridement: manual, sharp debridement. Instrumentation: Nail nipper, rotary burr. Number of Nails: 10     Bilateral LE Burning -Denies DM -No evidence of pedal cause -Recommend further eval for lower back   No follow-ups on file.

## 2020-05-04 ENCOUNTER — Other Ambulatory Visit: Payer: Self-pay

## 2020-05-04 ENCOUNTER — Emergency Department (HOSPITAL_COMMUNITY)
Admission: EM | Admit: 2020-05-04 | Discharge: 2020-05-05 | Disposition: A | Discharge status: Other Acute Inpatient Hospital | Payer: Medicare PPO | Attending: Emergency Medicine | Admitting: Emergency Medicine

## 2020-05-04 ENCOUNTER — Emergency Department (HOSPITAL_COMMUNITY): Payer: Medicare PPO

## 2020-05-04 DIAGNOSIS — F332 Major depressive disorder, recurrent severe without psychotic features: Secondary | ICD-10-CM | POA: Diagnosis present

## 2020-05-04 DIAGNOSIS — F17210 Nicotine dependence, cigarettes, uncomplicated: Secondary | ICD-10-CM | POA: Insufficient documentation

## 2020-05-04 DIAGNOSIS — I4891 Unspecified atrial fibrillation: Secondary | ICD-10-CM | POA: Diagnosis not present

## 2020-05-04 DIAGNOSIS — N183 Chronic kidney disease, stage 3 unspecified: Secondary | ICD-10-CM | POA: Diagnosis not present

## 2020-05-04 DIAGNOSIS — Z79899 Other long term (current) drug therapy: Secondary | ICD-10-CM | POA: Diagnosis not present

## 2020-05-04 DIAGNOSIS — I129 Hypertensive chronic kidney disease with stage 1 through stage 4 chronic kidney disease, or unspecified chronic kidney disease: Secondary | ICD-10-CM | POA: Diagnosis not present

## 2020-05-04 DIAGNOSIS — F1220 Cannabis dependence, uncomplicated: Secondary | ICD-10-CM | POA: Diagnosis not present

## 2020-05-04 DIAGNOSIS — F1410 Cocaine abuse, uncomplicated: Secondary | ICD-10-CM | POA: Diagnosis not present

## 2020-05-04 DIAGNOSIS — M6281 Muscle weakness (generalized): Secondary | ICD-10-CM | POA: Diagnosis not present

## 2020-05-04 DIAGNOSIS — F1490 Cocaine use, unspecified, uncomplicated: Secondary | ICD-10-CM | POA: Diagnosis present

## 2020-05-04 DIAGNOSIS — Z20822 Contact with and (suspected) exposure to covid-19: Secondary | ICD-10-CM | POA: Insufficient documentation

## 2020-05-04 DIAGNOSIS — Z7901 Long term (current) use of anticoagulants: Secondary | ICD-10-CM | POA: Insufficient documentation

## 2020-05-04 DIAGNOSIS — F17200 Nicotine dependence, unspecified, uncomplicated: Secondary | ICD-10-CM | POA: Diagnosis present

## 2020-05-04 DIAGNOSIS — R45851 Suicidal ideations: Secondary | ICD-10-CM | POA: Diagnosis not present

## 2020-05-04 LAB — URINALYSIS, ROUTINE W REFLEX MICROSCOPIC
Bilirubin Urine: NEGATIVE
Glucose, UA: NEGATIVE mg/dL
Ketones, ur: NEGATIVE mg/dL
Leukocytes,Ua: NEGATIVE
Nitrite: NEGATIVE
Protein, ur: >=300 mg/dL — AB
Specific Gravity, Urine: 1.013 (ref 1.005–1.030)
pH: 5 (ref 5.0–8.0)

## 2020-05-04 LAB — CBC
HCT: 51.2 % (ref 39.0–52.0)
Hemoglobin: 16.6 g/dL (ref 13.0–17.0)
MCH: 30.7 pg (ref 26.0–34.0)
MCHC: 32.4 g/dL (ref 30.0–36.0)
MCV: 94.8 fL (ref 80.0–100.0)
Platelets: 200 10*3/uL (ref 150–400)
RBC: 5.4 MIL/uL (ref 4.22–5.81)
RDW: 15.3 % (ref 11.5–15.5)
WBC: 7.7 10*3/uL (ref 4.0–10.5)
nRBC: 0 % (ref 0.0–0.2)

## 2020-05-04 LAB — RAPID URINE DRUG SCREEN, HOSP PERFORMED
Amphetamines: NOT DETECTED
Barbiturates: NOT DETECTED
Benzodiazepines: NOT DETECTED
Cocaine: POSITIVE — AB
Opiates: NOT DETECTED
Tetrahydrocannabinol: NOT DETECTED

## 2020-05-04 LAB — BASIC METABOLIC PANEL
Anion gap: 11 (ref 5–15)
BUN: 36 mg/dL — ABNORMAL HIGH (ref 8–23)
CO2: 24 mmol/L (ref 22–32)
Calcium: 9.7 mg/dL (ref 8.9–10.3)
Chloride: 107 mmol/L (ref 98–111)
Creatinine, Ser: 3.82 mg/dL — ABNORMAL HIGH (ref 0.61–1.24)
GFR, Estimated: 16 mL/min — ABNORMAL LOW (ref >60)
Glucose, Bld: 107 mg/dL — ABNORMAL HIGH (ref 70–99)
Potassium: 4.6 mmol/L (ref 3.5–5.1)
Sodium: 142 mmol/L (ref 135–145)

## 2020-05-04 LAB — TROPONIN I (HIGH SENSITIVITY)
Troponin I (High Sensitivity): 31 ng/L — ABNORMAL HIGH (ref <18)
Troponin I (High Sensitivity): 25 ng/L — ABNORMAL HIGH (ref <18)

## 2020-05-04 LAB — RESPIRATORY PANEL BY RT PCR (FLU A&B, COVID)
Influenza A by PCR: NEGATIVE
Influenza B by PCR: NEGATIVE
SARS Coronavirus 2 by RT PCR: NEGATIVE

## 2020-05-04 LAB — ETHANOL: Alcohol, Ethyl (B): <10 mg/dL (ref <10)

## 2020-05-04 MED ORDER — SODIUM CHLORIDE 0.9 % IV BOLUS
500.0000 mL | Freq: Once | INTRAVENOUS | Status: AC
  Administered 2020-05-04: 500 mL via INTRAVENOUS

## 2020-05-04 NOTE — ED Notes (Signed)
Pt changed into purple scrubs. Belongings secured

## 2020-05-04 NOTE — ED Provider Notes (Signed)
[facility] EMERGENCY DEPARTMENT Provider Note   CSN: [id number] Arrival date & time: 05/04/20  9381     History Chief Complaint  Patient presents with  . Weakness    Tyler Sawyer is a 79 y.o. male.  79 year old male with prior medical history as detailed below presents with multiple complaints.  Patient reports that he is currently homeless.  Patient reports that he uses cocaine frequently.  His last reported use of cocaine was 2 hours prior to arrival.  He reports to this examiner that he would like to "get off the cocaine".  He also complains of generalized weakness.  He also complains of nonspecific suicidal ideation.  He lacks a plans of self-harm.  It is unclear as to the length of time of his symptoms.  Patient denies chest pain, shortness of breath, nausea, vomiting, headache, vision change, focal weakness.  The history is provided by the patient and medical records.  Illness Location:  Homeless, weak, cocaine use, desires detox, suicidal ideation, Severity:  Mild Onset quality:  Unable to specify Timing:  Unable to specify Progression:  Unable to specify Chronicity:  New      Past Medical History:  Diagnosis Date  . GERD (gastroesophageal reflux disease)     Patient Active Problem List   Diagnosis Date Noted  . Cocaine substance abuse (HCC) 12/05/2019  . GERD (gastroesophageal reflux disease) 12/05/2019  . Tobacco use disorder 10/06/2017  . Polysubstance abuse (HCC) 06/19/2017  . Chest pain 03/18/2017  . New onset atrial flutter (HCC) 03/18/2017  . Atrial fibrillation with RVR (HCC) 03/18/2017  . Syncope and collapse 03/18/2017  . Musculoskeletal pain 03/18/2017  . Neck pain 03/18/2017  . Scalp pain 03/18/2017  . AKI (acute kidney injury) (HCC) 03/18/2017  . CKD (chronic kidney disease), stage III (HCC) 03/18/2017  . Cocaine use 03/18/2017  . Hyperglycemia 03/18/2017  . Atrial flutter with rapid ventricular response (HCC)   .  Chills 11/08/2013  . Confusion 11/08/2013  . Cough 11/08/2013  . Elevated serum creatinine 11/08/2013  . Fatigue 11/08/2013  . Hypertension 11/08/2013  . Malaise and fatigue 11/08/2013  . URI (upper respiratory infection) 11/08/2013  . Urinary urgency 11/08/2013    No past surgical history on file.     Family History  Problem Relation Age of Onset  . Diabetes Mother   . Diabetes Father     Social History   Tobacco Use  . Smoking status: Current Every Day Smoker    Types: Cigarettes  . Smokeless tobacco: Never Used  Substance Use Topics  . Alcohol use: No  . Drug use: No    Home Medications Prior to Admission medications   Medication Sig Start Date End Date Taking? Authorizing Provider  atorvastatin (LIPITOR) 80 MG tablet  12/12/19   [provider]  diltiazem (CARDIZEM CD) 240 MG 24 hr capsule Take 1 capsule (240 mg total) by mouth daily. 03/21/17   Alison Murray, MD  diltiazem [facility]) 240 MG 24 hr capsule Take by mouth. 12/07/19 01/11/20  [provider]  ELIQUIS 2.5 MG TABS tablet  12/12/19   [provider]  meclizine (ANTIVERT) 25 MG tablet  12/12/19   [provider]  Rivaroxaban (XARELTO) 15 MG TABS tablet Take 1 tablet (15 mg total) by mouth daily with supper. 03/21/17   Alison Murray, MD    Allergies    Penicillins  Review of Systems   Review of Systems  All other systems reviewed and are  negative.   Physical Exam Updated Vital Signs BP (!) 161/118 (BP Location: Right Arm)   Pulse (!) 116   Temp 97.9 F (36.6 C) (Oral)   Resp (!) 22   Ht 5\' 9"  (1.753 m)   SpO2 96%   BMI 29.49 kg/m   Physical Exam Vitals and nursing note reviewed.  Constitutional:      General: He is not in acute distress.    Appearance: Normal appearance. He is well-developed.  HENT:     Head: Normocephalic and atraumatic.  Eyes:     Conjunctiva/sclera: Conjunctivae normal.     Pupils: Pupils are equal, round, and reactive to light.    Cardiovascular:     Rate and Rhythm: Normal rate and regular rhythm.     Heart sounds: Normal heart sounds.  Pulmonary:     Effort: Pulmonary effort is normal. No respiratory distress.     Breath sounds: Normal breath sounds.  Abdominal:     General: There is no distension.     Palpations: Abdomen is soft.     Tenderness: There is no abdominal tenderness.  Musculoskeletal:        General: No deformity. Normal range of motion.     Cervical back: Normal range of motion and neck supple.  Skin:    General: Skin is warm and dry.  Neurological:     General: No focal deficit present.     Mental Status: He is alert and oriented to person, place, and time. Mental status is at baseline.     ED Results / Procedures / Treatments   Labs (all labs ordered are listed, but only abnormal results are displayed) Labs Reviewed  BASIC METABOLIC PANEL - Abnormal; Notable for the following components:      Result Value   Glucose, Bld 107 (*)    BUN 36 (*)    Creatinine, Ser 3.82 (*)    GFR, Estimated 16 (*)    All other components within normal limits  TROPONIN I (HIGH SENSITIVITY) - Abnormal; Notable for the following components:   Troponin I (High Sensitivity) 31 (*)    All other components within normal limits  TROPONIN I (HIGH SENSITIVITY) - Abnormal; Notable for the following components:   Troponin I (High Sensitivity) 25 (*)    All other components within normal limits  RESPIRATORY PANEL BY RT PCR (FLU A&B, COVID)  CBC  ETHANOL  URINALYSIS, ROUTINE W REFLEX MICROSCOPIC  RAPID URINE DRUG SCREEN, HOSP PERFORMED  CBG MONITORING, ED    EKG EKG Interpretation  Date/Time:  Friday May 04 2020 09:41:09 EDT Ventricular Rate:  112 PR Interval:  154 QRS Duration: 80 QT Interval:  346 QTC Calculation: 472 R Axis:   -4 Text Interpretation: Sinus tachycardia Otherwise normal ECG Confirmed by [id number] [phone]) on 05/04/2020 10:30:11 AM   Radiology No results  found.  Procedures Procedures (including critical care time)  Medications Ordered in ED Medications - No data to display  ED Course  I have reviewed the triage vital signs and the nursing notes.  Pertinent labs & imaging results that were available during my care of the patient were reviewed by me and considered in my medical decision making (see chart for details).  Clinical Course as of May 04 1432  Fri May 04, 2020  1146 Troponin I (High Sensitivity)(!): 31 [PM]  1156 Troponin I (High Sensitivity)(!): 31 [PM]    Clinical Course User Index [PM] May 06, 2020, MD   MDM Rules/Calculators/A&P  MDM  Screen complete   Tyler Sawyer was evaluated in Emergency Department on 05/04/2020 for the symptoms described in the history of present illness. He was evaluated in the context of the global COVID-19 pandemic, which necessitated consideration that the patient might be at risk for infection with the SARS-CoV-2 virus that causes COVID-19. Institutional protocols and algorithms that pertain to the evaluation of patients at risk for COVID-19 are in a state of rapid change based on information released by regulatory bodies including the CDC and federal and state organizations. These policies and algorithms were followed during the patient's care in the ED.  Patient is presenting for multiple complaints.  He does express mild suicidal ideation without specific plan.  He admits to frequent and regular cocaine use. His last reported cocaine use was 2 hours PTA.  He requests assistance with detox from his cocaine.  He denies any recent or active chest pain.  Patient's labs are without significant acute abnormality.  Patient's most recent admission at [facility] on June 2021 demonstrated a baseline creatinine of 4.06.  Patient is without reported chest discomfort.  EKG is without evidence of acute ischemia.  Delta troponin is 6.  Covid test is negative.   Patient appears to  be medically clear at this time for further psychiatric evaluation and treatment.   Final Clinical Impression(s) / ED Diagnoses Final diagnoses:  Suicidal ideation  Cocaine abuse [facility])    Rx / DC Orders ED Discharge Orders    None       Wynetta Fines, MD 05/04/20 1439

## 2020-05-04 NOTE — BH Assessment (Signed)
Tele Assessment Note   Patient Name: Tyler Sawyer MRN: [id number] Referring Physician: Kristine Royal, MD Location of Patient: [facility] ED, H021C Location of Provider: Behavioral Health TTS Department  Tyler Sawyer Tyler Sawyer is an 79 y.o. divorced male who presents unaccompanied to [facility] ED reporting depressive symptoms and cocaine use. He says he has felt weak since having a heart attack. Pt says that he has "mood swings" and feels depressed "every day." He acknowledges suicidal thoughts and says "I'd be better off dead." He denies current plan to kill himself. He reports he has attempted suicide in the past, once by driving off a bridge and another time by ingesting kerosene and rat poison. He reports social withdrawal, loss of interest in usual pleasures, fatigue, irritability and feelings of hopelessness. Pt denies any history of intentional self-injurious behaviors. Pt denies current homicidal ideation or history of violence. Pt denies any history of auditory or visual hallucinations. Pt says he uses cocaine 1-2 times per month because he cannot afford to use more frequently. He says he does not drink alcohol or use other substances.  Pt reports he is currently homeless. He says he was staying at a [facility]'s shelter but now has no place to stay. He says he has no family or friends who are supportive. 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 He says he was married with three children but his wife was having sex with other men while he was on the road and none of his children are biologically his. He says his children no longer speak to him. He says he has two sisters who want nothing to do with him. He denies currently legal problems. He denies access to firearms. He denies any history of inpatient or outpatient mental health treatment.   Pt is dressed in hospital scrubs, alert and oriented x4. Pt speaks in a clear tone, at moderate volume and normal pace. Motor behavior appears normal. Eye contact  is good. Pt's mood is depressed and affect is congruent with mood. Thought process is coherent and relevant. There is no indication Pt is currently responding to internal stimuli or experiencing delusional thought content. Pt was cooperative throughout assessment. He is requesting any assistance that the hospital can provide.   Diagnosis:  F33.2 Major depressive disorder, Recurrent episode, Severe F12.20 Cannabis use disorder, Severe  Past Medical History:  Past Medical History:  Diagnosis Date  . GERD (gastroesophageal reflux disease)     No past surgical history on file.  Family History:  Family History  Problem Relation Age of Onset  . Diabetes Mother   . Diabetes Father     Social History:  reports that he has been smoking cigarettes. He has never used smokeless tobacco. He reports that he does not drink alcohol and does not use drugs.  Additional Social History:  Alcohol / Drug Use Pain Medications: Denies abuse Prescriptions: Denies abuse Over the Counter: Denies abuse History of alcohol / drug use?: Yes Longest period of sobriety (when/how long): Unknown Negative Consequences of Use: Financial Withdrawal Symptoms:  (Pt denies) Substance #1 Name of Substance 1: Cocaine 1 - Age of First Use: 20s 1 - Amount (size/oz): Varies according to availability 1 - Frequency: 1-2 times per month 1 - Duration: Ongoing for years 1 - Last Use / Amount: 05/03/2020  CIWA: CIWA-Ar BP: 132/78 Pulse Rate: 91 COWS:    Allergies:  Allergies  Allergen Reactions  . Penicillins Anaphylaxis    Has patient had a PCN  reaction causing immediate rash, facial/tongue/throat swelling, SOB or lightheadedness with hypotension: Yes Has patient had a PCN reaction causing severe rash involving mucus membranes or skin necrosis: Yes Has patient had a PCN reaction that required hospitalization No Has patient had a PCN reaction occurring within the last 10 years: No If all of the above answers are  "NO", then may proceed with Cephalosporin use.     Home Medications: (Not in a hospital admission)   OB/GYN Status:  No LMP for male patient.  General Assessment Data Location of Assessment: [facility] ED TTS Assessment: In system Is this a Tele or Face-to-Face Assessment?: Tele Assessment Is this an Initial Assessment or a Re-assessment for this encounter?: Initial Assessment Patient Accompanied by:: N/A Language Other than English: No Living Arrangements: Homeless/Shelter What gender do you identify as?: Male Date Telepsych consult ordered in [facility]: 05/04/20 Time Telepsych consult ordered in [facility]: 1719 Marital status: Divorced Maiden name: NA Pregnancy Status: No Living Arrangements: Alone Can pt return to current living arrangement?: Yes Admission Status: Voluntary Is patient capable of signing voluntary admission?: Yes Referral Source: Self/Family/Friend Insurance type: [employer]     Crisis Care Plan Living Arrangements: Alone Legal Guardian: Other: (Self) Name of Psychiatrist: None Name of Therapist: None  Education Status Is patient currently in school?: No Is the patient employed, unemployed or receiving disability?: Unemployed  Risk to self with the past 6 months Suicidal Ideation: Yes-Currently Present Has patient been a risk to self within the past 6 months prior to admission? : No Suicidal Intent: No Has patient had any suicidal intent within the past 6 months prior to admission? : No Is patient at risk for suicide?: No Suicidal Plan?: No Has patient had any suicidal plan within the past 6 months prior to admission? : No Access to Means: No What has been your use of drugs/alcohol within the last 12 months?: Pt reports using cocaine Previous Attempts/Gestures: Yes How many times?: 2 Other Self Harm Risks: None Triggers for Past Attempts: Other personal contacts Intentional Self Injurious Behavior: None Family Suicide History: No Recent stressful life  event(s): Financial Problems, Other (Comment) (Poor support) Persecutory voices/beliefs?: No Depression: Yes Depression Symptoms: Despondent, Isolating, Fatigue, Feeling worthless/self pity, Feeling angry/irritable Substance abuse history and/or treatment for substance abuse?: No Suicide prevention information given to non-admitted patients: Not applicable  Risk to Others within the past 6 months Homicidal Ideation: No Does patient have any lifetime risk of violence toward others beyond the six months prior to admission? : No Thoughts of Harm to Others: No Current Homicidal Intent: No Current Homicidal Plan: No Access to Homicidal Means: No Identified Victim: None History of harm to others?: No Assessment of Violence: None Noted Violent Behavior Description: Pt denies history of violence Does patient have access to weapons?: No Criminal Charges Pending?: No Does patient have a court date: No Is patient on probation?: No  Psychosis Hallucinations: None noted Delusions: None noted  Mental Status Report Appearance/Hygiene: In scrubs Eye Contact: Good Motor Activity: Unremarkable Speech: Logical/coherent Level of Consciousness: Alert Mood: Depressed Affect: Depressed Anxiety Level: None Thought Processes: Relevant, Coherent Judgement: Partial Orientation: Person, Place, Time, Situation, Appropriate for developmental age Obsessive Compulsive Thoughts/Behaviors: None  Cognitive Functioning Concentration: Normal Memory: Recent Intact, Remote Intact Is patient IDD: No Insight: Fair Impulse Control: Fair Appetite: Good Have you had any weight changes? : No Change Sleep: Decreased Total Hours of Sleep: 6 Vegetative Symptoms: None  ADLScreening [facility] Assessment Services) Patient's cognitive ability adequate to safely complete  daily activities?: Yes Patient able to express need for assistance with ADLs?: Yes Independently performs ADLs?: Yes (appropriate for developmental  age)  Prior Inpatient Therapy Prior Inpatient Therapy: No  Prior Outpatient Therapy Prior Outpatient Therapy: No Does patient have an ACCT team?: No Does patient have Intensive In-House Services?  : No Does patient have [facility] services? : No Does patient have P4CC services?: No  ADL Screening (condition at time of admission) Patient's cognitive ability adequate to safely complete daily activities?: Yes Is the patient deaf or have difficulty hearing?: No Does the patient have difficulty seeing, even when wearing glasses/contacts?: No Does the patient have difficulty concentrating, remembering, or making decisions?: No Patient able to express need for assistance with ADLs?: Yes Does the patient have difficulty dressing or bathing?: No Independently performs ADLs?: Yes (appropriate for developmental age) Does the patient have difficulty walking or climbing stairs?: No Weakness of Legs: Both Weakness of Arms/Hands: None  Home Assistive Devices/Equipment Home Assistive Devices/Equipment: None    Abuse/Neglect Assessment (Assessment to be complete while patient is alone) Abuse/Neglect Assessment Can Be Completed: Yes Physical Abuse: Denies Verbal Abuse: Denies Sexual Abuse: Denies Exploitation of patient/patient's resources: Denies Self-Neglect: Denies     Merchant navy officer (For Healthcare) Does Patient Have a Medical Advance Directive?: No Would patient like information on creating a medical advance directive?: No - Patient declined          Disposition: Gave clinical report to Otila Back, PA who recommended Pt be transferred to [facility] for continuous assessment. Notified Dr. Jaci Carrel and Mitchel Honour, RN of recommendation. Notified [facility] staff of transfer.  Disposition Initial Assessment Completed for this Encounter: Yes  This service was provided via telemedicine using a 2-way, interactive audio and video technology.  Names of all persons participating in  this telemedicine service and their role in this encounter. Name: Vevelyn Francois Role: Patient  Name: Shela Commons, [facility] Role: TTS counselor         Harlin Rain Patsy [place], [facility], [facility] Triage Specialist [phone]  Pamalee Leyden 05/04/2020 11:51 PM

## 2020-05-04 NOTE — ED Triage Notes (Addendum)
Pt arrives via a bus is currently homeless and states he is here due to weakness all over as well as pain all over. He states he has not been taking any of his mediations recently.

## 2020-05-04 NOTE — ED Notes (Signed)
TTS in progress 

## 2020-05-05 ENCOUNTER — Other Ambulatory Visit: Payer: Self-pay

## 2020-05-05 ENCOUNTER — Ambulatory Visit (HOSPITAL_COMMUNITY)
Admission: EM | Admit: 2020-05-05 | Discharge: 2020-05-05 | Disposition: A | Discharge status: Home / Self Care | Payer: Medicare PPO | Attending: Family | Admitting: Family

## 2020-05-05 DIAGNOSIS — Z5900 Homelessness unspecified: Secondary | ICD-10-CM | POA: Diagnosis not present

## 2020-05-05 DIAGNOSIS — R45851 Suicidal ideations: Secondary | ICD-10-CM | POA: Insufficient documentation

## 2020-05-05 DIAGNOSIS — F1410 Cocaine abuse, uncomplicated: Secondary | ICD-10-CM | POA: Insufficient documentation

## 2020-05-05 MED ORDER — DILTIAZEM HCL ER COATED BEADS 120 MG PO CP24
240.0000 mg | ORAL_CAPSULE | Freq: Every day | ORAL | Status: DC
  Administered 2020-05-05: 240 mg via ORAL
  Filled 2020-05-05: qty 2

## 2020-05-05 NOTE — ED Notes (Signed)
[facility] called for transportation to [facility]. 

## 2020-05-05 NOTE — ED Notes (Signed)
Patient given sandwich, chips, drink

## 2020-05-05 NOTE — ED Provider Notes (Signed)
Behavioral Health Admission H&P [facility] & OBS)  Date: 05/05/20 Patient Name: Tyler Sawyer MRN: [id number] Chief Complaint:  Chief Complaint  Patient presents with  . Suicidal  . Addiction Problem     Diagnoses: Cocaine abuse  HPI: Tyler Sawyer is a  79 year old male with a prior medical history who presents with multiple complaints.  The patient reports that he is currently homeless.  The patient says that he uses cocaine frequently.  His last reported use of cocaine was 2 hours before arrival.  He reports to this provider that he would like to "get off the cocaine."  He also complains of generalized weakness.  He also complains of nonspecific suicidal ideation.  He lacks a plan of self-harm.  PHQ 2-9:     Total Time spent with patient: 20 minutes  Musculoskeletal  Strength & Muscle Tone: within normal limits Gait & Station: normal Patient leans: N/A  Psychiatric Specialty Exam  Presentation General Appearance: Appropriate for Environment  Eye Contact:Fair  Speech:Clear and Coherent  Speech Volume:Normal  Handedness:Right   Mood and Affect  Mood:Anxious;Depressed;Hopeless  Affect:Congruent;Blunt   Thought Process  Thought Processes:Disorganized  Descriptions of Associations:Tangential  Orientation:Full (Time, Place and Person)  Thought Content:Illogical;Paranoid Ideation  Hallucinations:Hallucinations: None  Ideas of Reference:None  Suicidal Thoughts:Suicidal Thoughts: Yes, Active SI Active Intent and/or Plan: Without Intent;Without Plan  Homicidal Thoughts:Homicidal Thoughts: No   Sensorium  Memory:Immediate Poor;Recent Poor;Remote Fair  Judgment:No data recorded Insight:Poor   Executive Functions  Concentration:Poor  Attention Span:Poor  Recall:Fair  Fund of Knowledge:Fair  Language:No data recorded  Psychomotor Activity  Psychomotor Activity:Psychomotor Activity: Normal   Assets  Assets:Desire for Improvement;Housing;Physical  Health;Resilience   Sleep  Sleep:Sleep: Fair   Physical Exam Vitals and nursing note reviewed.  Constitutional:      Appearance: Normal appearance.  HENT:     Right Ear: Tympanic membrane normal.     Left Ear: Tympanic membrane normal.     Nose: Nose normal.  Abdominal:     General: Abdomen is flat.  Musculoskeletal:        General: Normal range of motion.     Cervical back: Normal range of motion. Rigidity present.  Neurological:     Mental Status: He is alert.  Psychiatric:        Attention and Perception: Attention normal.        Mood and Affect: Mood is anxious and depressed.        Speech: Speech is delayed.        Behavior: Behavior is agitated. Behavior is cooperative.        Thought Content: Thought content includes suicidal ideation.        Cognition and Memory: Memory is impaired.        Judgment: Judgment is inappropriate.    Review of Systems  Psychiatric/Behavioral: Positive for depression, substance abuse and suicidal ideas. The patient is nervous/anxious.   All other systems reviewed and are negative.   Blood pressure (!) 128/103, pulse (!) 114, temperature (!) 97.3 F (36.3 C), temperature source Oral, resp. rate 20, height 5' 7.32" (1.71 m), weight 97.1 kg, SpO2 98 %. Body mass index is 33.2 kg/m.  Past Psychiatric History:   Is the patient at risk to self? No  Has the patient been a risk to self in the past 6 months? No .    Has the patient been a risk to self within the distant past? No   Is the patient a risk to others? No  Has the patient been a risk to others in the past 6 months? No   Has the patient been a risk to others within the distant past? No   Past Medical History:  Past Medical History:  Diagnosis Date  . GERD (gastroesophageal reflux disease)    No past surgical history on file.  Family History:  Family History  Problem Relation Age of Onset  . Diabetes Mother   . Diabetes Father     Social History:  Social History    Socioeconomic History  . Marital status: Single    Spouse name: Not on file  . Number of children: Not on file  . Years of education: Not on file  . Highest education level: Not on file  Occupational History  . Not on file  Tobacco Use  . Smoking status: Current Every Day Smoker    Types: Cigarettes  . Smokeless tobacco: Never Used  Substance and Sexual Activity  . Alcohol use: No  . Drug use: No  . Sexual activity: Not on file  Other Topics Concern  . Not on file  Social History Narrative  . Not on file   Social Determinants of Health   Financial Resource Strain:   . Difficulty of Paying Living Expenses: Not on file  Food Insecurity:   . Worried About Programme researcher, broadcasting/film/video in the Last Year: Not on file  . Ran Out of Food in the Last Year: Not on file  Transportation Needs:   . Lack of Transportation (Medical): Not on file  . Lack of Transportation (Non-Medical): Not on file  Physical Activity:   . Days of Exercise per Week: Not on file  . Minutes of Exercise per Session: Not on file  Stress:   . Feeling of Stress : Not on file  Social Connections:   . Frequency of Communication with Friends and Family: Not on file  . Frequency of Social Gatherings with Friends and Family: Not on file  . Attends Religious Services: Not on file  . Active Member of Clubs or Organizations: Not on file  . Attends Banker Meetings: Not on file  . Marital Status: Not on file  Intimate Partner Violence:   . Fear of Current or Ex-Partner: Not on file  . Emotionally Abused: Not on file  . Physically Abused: Not on file  . Sexually Abused: Not on file    SDOH:  SDOH Screenings   Alcohol Screen:   . Last Alcohol Screening Score (AUDIT): Not on file  Depression (PHQ2-9):   . PHQ-2 Score: Not on file  Financial Resource Strain:   . Difficulty of Paying Living Expenses: Not on file  Food Insecurity:   . Worried About Programme researcher, broadcasting/film/video in the Last Year: Not on file  .  Ran Out of Food in the Last Year: Not on file  Housing:   . Last Housing Risk Score: Not on file  Physical Activity:   . Days of Exercise per Week: Not on file  . Minutes of Exercise per Session: Not on file  Social Connections:   . Frequency of Communication with Friends and Family: Not on file  . Frequency of Social Gatherings with Friends and Family: Not on file  . Attends Religious Services: Not on file  . Active Member of Clubs or Organizations: Not on file  . Attends Banker Meetings: Not on file  . Marital Status: Not on file  Stress:   . Feeling of  Stress : Not on file  Tobacco Use:   . Smoking Tobacco Use: Not on file  . Smokeless Tobacco Use: Not on file  Transportation Needs:   . Lack of Transportation (Medical): Not on file  . Lack of Transportation (Non-Medical): Not on file    Last Labs:  Admission on 05/04/2020, Discharged on 05/05/2020  Component Date Value Ref Range Status  . Sodium 05/04/2020 142  135 - 145 mmol/L Final  . Potassium 05/04/2020 4.6  3.5 - 5.1 mmol/L Final  . Chloride 05/04/2020 107  98 - 111 mmol/L Final  . CO2 05/04/2020 24  22 - 32 mmol/L Final  . Glucose, Bld 05/04/2020 107* 70 - 99 mg/dL Final   Glucose reference range applies only to samples taken after fasting for at least 8 hours.  . BUN 05/04/2020 36* 8 - 23 mg/dL Final  . Creatinine, Ser 05/04/2020 3.82* 0.61 - 1.24 mg/dL Final  . Calcium 72/02/4708 9.7  8.9 - 10.3 mg/dL Final  . GFR, Estimated 05/04/2020 16* >60 mL/min Final   Comment: (NOTE) Calculated using the CKD-EPI Creatinine Equation (2021)   . Anion gap 05/04/2020 11  5 - 15 Final   Performed at [facility], [address] [place], [place] [address]  . WBC 05/04/2020 7.7  4.0 - 10.5 K/uL Final  . RBC 05/04/2020 5.40  4.22 - 5.81 MIL/uL Final  . Hemoglobin 05/04/2020 16.6  13.0 - 17.0 g/dL Final  . HCT 62/94/7654 51.2  39 - 52 % Final  . MCV 05/04/2020 94.8  80.0 - 100.0 fL Final  . MCH 05/04/2020  30.7  26.0 - 34.0 pg Final  . MCHC 05/04/2020 32.4  30.0 - 36.0 g/dL Final  . RDW 65/08/5463 15.3  11.5 - 15.5 % Final  . Platelets 05/04/2020 200  150 - 400 K/uL Final  . nRBC 05/04/2020 0.0  0.0 - 0.2 % Final   Performed at [facility], [address] [place], [place] [address]  . Color, Urine 05/04/2020 YELLOW  YELLOW Final  . APPearance 05/04/2020 CLEAR  CLEAR Final  . Specific Gravity, Urine 05/04/2020 1.013  1.005 - 1.030 Final  . pH 05/04/2020 5.0  5.0 - 8.0 Final  . Glucose, UA 05/04/2020 NEGATIVE  NEGATIVE mg/dL Final  . Hgb urine dipstick 05/04/2020 SMALL* NEGATIVE Final  . Bilirubin Urine 05/04/2020 NEGATIVE  NEGATIVE Final  . Ketones, ur 05/04/2020 NEGATIVE  NEGATIVE mg/dL Final  . Protein, ur 51/70/0174 >=300* NEGATIVE mg/dL Final  . Nitrite 94/49/6759 NEGATIVE  NEGATIVE Final  . Glori Luis 05/04/2020 NEGATIVE  NEGATIVE Final  . RBC / HPF 05/04/2020 0-5  0 - 5 RBC/hpf Final  . WBC, UA 05/04/2020 6-10  0 - 5 WBC/hpf Final  . Bacteria, UA 05/04/2020 RARE* NONE SEEN Final  . Squamous Epithelial / LPF 05/04/2020 0-5  0 - 5 Final   Performed at [facility], [address] [place], [place] [address]  . Opiates 05/04/2020 NONE DETECTED  NONE DETECTED Final  . Cocaine 05/04/2020 POSITIVE* NONE DETECTED Final  . Benzodiazepines 05/04/2020 NONE DETECTED  NONE DETECTED Final  . Amphetamines 05/04/2020 NONE DETECTED  NONE DETECTED Final  . Tetrahydrocannabinol 05/04/2020 NONE DETECTED  NONE DETECTED Final  . Barbiturates 05/04/2020 NONE DETECTED  NONE DETECTED Final   Comment: (NOTE) DRUG SCREEN FOR MEDICAL PURPOSES ONLY.  IF CONFIRMATION IS NEEDED FOR ANY PURPOSE, NOTIFY LAB WITHIN 5 DAYS.  LOWEST DETECTABLE LIMITS FOR URINE DRUG SCREEN Drug Class  Cutoff (ng/mL) Amphetamine and metabolites    1000 Barbiturate and metabolites    200 Benzodiazepine                 200 Tricyclics and metabolites     300 Opiates and metabolites         300 Cocaine and metabolites        300 THC                            50 Performed at [facility], [address] [place], [place] [address]   . Alcohol, Ethyl (B) 05/04/2020 <10  <10 mg/dL Final   Comment: (NOTE) Lowest detectable limit for serum alcohol is 10 mg/dL.  For medical purposes only. Performed at [facility], [address] [place], [place] [address]   . Troponin I (High Sensitivity) 05/04/2020 31* <18 ng/L Final   Comment: (NOTE) Elevated high sensitivity troponin I (hsTnI) values and significant  changes across serial measurements may suggest ACS but many other  chronic and acute conditions are known to elevate hsTnI results.  Refer to the "Links" section for chest pain algorithms and additional  guidance. Performed at [facility], [address], [place], [place] [address]   . SARS Coronavirus 2 by RT PCR 05/04/2020 NEGATIVE  NEGATIVE Final   Comment: (NOTE) SARS-CoV-2 target nucleic acids are NOT DETECTED.  The SARS-CoV-2 RNA is generally detectable in upper respiratoy specimens during the acute phase of infection. The lowest concentration of SARS-CoV-2 viral copies this assay can detect is 131 copies/mL. A negative result does not preclude SARS-Cov-2 infection and should not be used as the sole basis for treatment or other patient management decisions. A negative result may occur with  improper specimen collection/handling, submission of specimen other than nasopharyngeal swab, presence of viral mutation(s) within the areas targeted by this assay, and inadequate number of viral copies (<131 copies/mL). A negative result must be combined with clinical observations, patient history, and epidemiological information. The expected result is Negative.  Fact Sheet for Patients:  https://www.moore.com/  Fact Sheet for Healthcare Providers:  https://www.young.biz/  This test is no                           t yet approved or cleared by the [place] FDA and  has been authorized for detection and/or diagnosis of SARS-CoV-2 by FDA under an Emergency Use Authorization (EUA). This EUA will remain  in effect (meaning this test can be used) for the duration of the COVID-19 declaration under Section 564(b)(1) of the Act, 21 U.S.C. section 360bbb-3(b)(1), unless the authorization is terminated or revoked sooner.    . Influenza A by PCR 05/04/2020 NEGATIVE  NEGATIVE Final  . Influenza B by PCR 05/04/2020 NEGATIVE  NEGATIVE Final   Comment: (NOTE) The Xpert Xpress SARS-CoV-2/FLU/RSV assay is intended as an aid in  the diagnosis of influenza from Nasopharyngeal swab specimens and  should not be used as a sole basis for treatment. Nasal washings and  aspirates are unacceptable for Xpert Xpress SARS-CoV-2/FLU/RSV  testing.  Fact Sheet for Patients: https://www.moore.com/  Fact Sheet for Healthcare Providers: https://www.young.biz/  This test is not yet approved or cleared by the [place] FDA and  has been authorized for detection and/or diagnosis of SARS-CoV-2 by  FDA under an Emergency Use Authorization (EUA). This EUA will remain  in effect (meaning  this test can be used) for the duration of the  Covid-19 declaration under Section 564(b)(1) of the Act, 21  U.S.C. section 360bbb-3(b)(1), unless the authorization is  terminated or revoked. Performed at [facility], [address] [place], [place] [address]   . Troponin I (High Sensitivity) 05/04/2020 25* <18 ng/L Final   Comment: (NOTE) Elevated high sensitivity troponin I (hsTnI) values and significant  changes across serial measurements may suggest ACS but many other  chronic and acute conditions are known to elevate hsTnI results.  Refer to the "Links" section for chest pain algorithms and additional  guidance. Performed at [facility], [address] [place],  [place] [id number]     Allergies: Penicillins  PTA Medications: (Not in a hospital admission)   Medical Decision Making    Recommendations  Based on my evaluation the patient does not appear to have an emergency medical condition. The patient will remain under OBS overnight and re-assess in the mornings.  Gillermo MurdochJacqueline Elena Cothern, NP 05/05/20  4:54 AM

## 2020-05-05 NOTE — ED Provider Notes (Signed)
FBC/OBS ASAP Discharge Summary  Date and Time: 05/05/2020 11:07 AM  Name: Tyler Sawyer  MRN:  [id number]   Discharge Diagnoses:  Final diagnoses:  Cocaine substance abuse (HCC)    Subjective: Patient states "for a long time I have had a problem with homelessness and I have smoked crack cocaine since 1988."  Patient reports recent stressors include homelessness, financial concerns and substance use concerns.  Patient reports he has most recently resided in a man shelter but will not return to the shelter as it has bedbugs.  Patient reports he typically resides in [facility] but would prefer not to return to [place] as he believes he could be triggered to use cocaine there.  Patient prefers long-term substance use treatment but states "if it comes to it I am willing to go to a shelter."  Patient reports he prefers to have his own space.  Patient reports he receives Social Security income but unfortunately typically spends it entirely on crack cocaine.  Patient reports he is currently homeless.  Patient denies access to weapons.  Patient is retired.  Patient denies alcohol use.  Patient endorses cocaine use "as often as I have the money."  Patient denies any mental health history.  Patient is not followed by outpatient psychiatry.  Patient assessed by nurse practitioner.  Patient alert and oriented, answers appropriately.  Patient pleasant cooperative during assessment.  Patient denies suicidal and homicidal ideations.  Patient denies auditory and visual hallucinations.  There is no evidence of delusional thought content and no indication patient is responding to internal stimuli.  Patient denies symptoms of paranoia.  Patient endorses average sleep and appetite.  Patient declines anyone to contact for collateral information at this time.  Stay Summary:  Per TTS assessment:Tyler Sawyer is an 79 y.o. divorced male who presents unaccompanied to [facility] ED reporting depressive  symptoms and cocaine use. He says he has felt weak since having a heart attack. Pt says that he has "mood swings" and feels depressed "every day." He acknowledges suicidal thoughts and says "I'd be better off dead." He denies current plan to kill himself. He reports he has attempted suicide in the past, once by driving off a bridge and another time by ingesting kerosene and rat poison. He reports social withdrawal, loss of interest in usual pleasures, fatigue, irritability and feelings of hopelessness. Pt denies any history of intentional self-injurious behaviors. Pt denies current homicidal ideation or history of violence. Pt denies any history of auditory or visual hallucinations. Pt says he uses cocaine 1-2 times per month because he cannot afford to use more frequently. He says he does not drink alcohol or use other substances.    Total Time spent with patient: 30 minutes  Past Psychiatric History: Cocaine use disorder Past Medical History:  Past Medical History:  Diagnosis Date  . GERD (gastroesophageal reflux disease)    No past surgical history on file. Family History:  Family History  Problem Relation Age of Onset  . Diabetes Mother   . Diabetes Father    Family Psychiatric History: None reported Social History:  Social History   Substance and Sexual Activity  Alcohol Use No     Social History   Substance and Sexual Activity  Drug Use No    Social History   Socioeconomic History  . Marital status: Single    Spouse name: Not on file  . Number of children: Not on file  . Years of education: Not on file  .  Highest education level: Not on file  Occupational History  . Not on file  Tobacco Use  . Smoking status: Current Every Day Smoker    Types: Cigarettes  . Smokeless tobacco: Never Used  Substance and Sexual Activity  . Alcohol use: No  . Drug use: No  . Sexual activity: Not on file  Other Topics Concern  . Not on file  Social History Narrative  . Not on file    Social Determinants of Health   Financial Resource Strain:   . Difficulty of Paying Living Expenses: Not on file  Food Insecurity:   . Worried About Programme researcher, broadcasting/film/video in the Last Year: Not on file  . Ran Out of Food in the Last Year: Not on file  Transportation Needs:   . Lack of Transportation (Medical): Not on file  . Lack of Transportation (Non-Medical): Not on file  Physical Activity:   . Days of Exercise per Week: Not on file  . Minutes of Exercise per Session: Not on file  Stress:   . Feeling of Stress : Not on file  Social Connections:   . Frequency of Communication with Friends and Family: Not on file  . Frequency of Social Gatherings with Friends and Family: Not on file  . Attends Religious Services: Not on file  . Active Member of Clubs or Organizations: Not on file  . Attends Banker Meetings: Not on file  . Marital Status: Not on file   SDOH:  SDOH Screenings   Alcohol Screen:   . Last Alcohol Screening Score (AUDIT): Not on file  Depression (PHQ2-9):   . PHQ-2 Score: Not on file  Financial Resource Strain:   . Difficulty of Paying Living Expenses: Not on file  Food Insecurity:   . Worried About Programme researcher, broadcasting/film/video in the Last Year: Not on file  . Ran Out of Food in the Last Year: Not on file  Housing:   . Last Housing Risk Score: Not on file  Physical Activity:   . Days of Exercise per Week: Not on file  . Minutes of Exercise per Session: Not on file  Social Connections:   . Frequency of Communication with Friends and Family: Not on file  . Frequency of Social Gatherings with Friends and Family: Not on file  . Attends Religious Services: Not on file  . Active Member of Clubs or Organizations: Not on file  . Attends Banker Meetings: Not on file  . Marital Status: Not on file  Stress:   . Feeling of Stress : Not on file  Tobacco Use:   . Smoking Tobacco Use: Not on file  . Smokeless Tobacco Use: Not on file   Transportation Needs:   . Lack of Transportation (Medical): Not on file  . Lack of Transportation (Non-Medical): Not on file    Has this patient used any form of tobacco in the last 30 days? (Cigarettes, Smokeless Tobacco, Cigars, and/or Pipes) A prescription for an FDA-approved tobacco cessation medication was offered at discharge and the patient refused  Current Medications:  No current facility-administered medications for this encounter.   Current Outpatient Medications  Medication Sig Dispense Refill  . atorvastatin (LIPITOR) 80 MG tablet     . diltiazem (CARDIZEM CD) 240 MG 24 hr capsule Take 1 capsule (240 mg total) by mouth daily. 30 capsule 1  . diltiazem (TIAZAC) 240 MG 24 hr capsule Take by mouth.    . ELIQUIS 2.5 MG TABS  tablet     . meclizine (ANTIVERT) 25 MG tablet     . Rivaroxaban (XARELTO) 15 MG TABS tablet Take 1 tablet (15 mg total) by mouth daily with supper. 42 tablet 0    PTA Medications: (Not in a hospital admission)   Musculoskeletal  Strength & Muscle Tone: within normal limits Gait & Station: normal Patient leans: N/A  Psychiatric Specialty Exam  Presentation  General Appearance: Appropriate for Environment;Casual  Eye Contact:Good  Speech:Clear and Coherent;Normal Rate  Speech Volume:Normal  Handedness:Right   Mood and Affect  Mood:Euthymic  Affect:Appropriate;Congruent   Thought Process  Thought Processes:Coherent;Goal Directed  Descriptions of Associations:Intact  Orientation:Full (Time, Place and Person)  Thought Content:Logical;WDL  Hallucinations:Hallucinations: None  Ideas of Reference:None  Suicidal Thoughts:Suicidal Thoughts: No SI Active Intent and/or Plan: Without Intent;Without Plan  Homicidal Thoughts:Homicidal Thoughts: No   Sensorium  Memory:Immediate Good;Recent Good;Remote Good  Judgment:Fair  Insight:Fair   Executive Functions  Concentration:Good  Attention Span:Good  Recall:Good  Fund of  Knowledge:Good  Language:Good   Psychomotor Activity  Psychomotor Activity:Psychomotor Activity: Normal   Assets  Assets:Communication Skills;Desire for Improvement;Financial Resources/Insurance;Physical Health;Resilience   Sleep  Sleep:Sleep: Fair   Physical Exam  Physical Exam Vitals and nursing note reviewed.  Constitutional:      Appearance: He is well-developed.  HENT:     Head: Normocephalic.  Cardiovascular:     Rate and Rhythm: Normal rate.  Pulmonary:     Effort: Pulmonary effort is normal.  Neurological:     Mental Status: He is alert and oriented to person, place, and time.  Psychiatric:        Attention and Perception: Attention and perception normal.        Mood and Affect: Mood and affect normal.        Speech: Speech normal.        Behavior: Behavior normal. Behavior is cooperative.        Thought Content: Thought content normal.        Cognition and Memory: Cognition and memory normal.        Judgment: Judgment normal.    Review of Systems  Constitutional: Negative.   HENT: Negative.   Eyes: Negative.   Respiratory: Negative.   Cardiovascular: Negative.   Gastrointestinal: Negative.   Genitourinary: Negative.   Musculoskeletal: Negative.   Skin: Negative.   Neurological: Negative.   Endo/Heme/Allergies: Negative.   Psychiatric/Behavioral: Positive for substance abuse.   Blood pressure (!) 149/103, pulse (!) 105, temperature 97.8 F (36.6 C), temperature source Tympanic, resp. rate 18, height 5' 7.32" (1.71 m), weight 214 lb (97.1 kg), SpO2 98 %. Body mass index is 33.2 kg/m.  Demographic Factors:  Male and Age 57 or older  Loss Factors: NA  Historical Factors: NA  Risk Reduction Factors:   Positive social support, Positive therapeutic relationship and Positive coping skills or problem solving skills  Continued Clinical Symptoms:  Alcohol/Substance Abuse/Dependencies  Cognitive Features That Contribute To Risk:  None     Suicide Risk:  Minimal: No identifiable suicidal ideation.  Patients presenting with no risk factors but with morbid ruminations; may be classified as minimal risk based on the severity of the depressive symptoms  Plan Of Care/Follow-up recommendations:  Patient reviewed with Dr. Jannifer Franklin. Other:  Follow-up with substance use treatment resources and outpatient psychiatry.  Disposition: Discharge  Patrcia Dolly, FNP 05/05/2020, 11:07 AM

## 2020-05-05 NOTE — ED Notes (Signed)
Attempted to call report to [facility] asked if they could call back

## 2020-05-05 NOTE — ED Notes (Signed)
Pt A&O x 4, [facility] transfer presents with suicidal ideations, pt denies at present.  Requesting detox from cocaine, pt reports he is homeless. Denies HI or AVH.  SKin search completed, pt calm & cooperative, monitoring for safety.

## 2020-05-05 NOTE — ED Notes (Signed)
Pt resting with eyes closed. Rise and fall of chest noted. No new issues noted. Will continue to monitor for safety 

## 2020-05-05 NOTE — ED Notes (Signed)
[facility] RN accepted report

## 2020-05-05 NOTE — ED Triage Notes (Signed)
Presents with suicidal ideations and Detox. Denies at present.

## 2020-05-05 NOTE — Progress Notes (Signed)
CSW faxed referrals to the following treatment programs:  -[facility]  CSW will continue to seek appropriate placement for this patient.      Ruthann Cancer MSW, LCSW Clincal Social Worker  [facility]

## 2020-05-05 NOTE — Discharge Instructions (Addendum)
Take all of your medications as prescribed by your mental healthcare provider.  Report any adverse effects and reactions from your medications to your outpatient provider promptly.  Do not engage in alcohol and or illegal drug use while on prescription medicines. Keep all scheduled appointments. This is to ensure that you are getting refills on time and to avoid any interruption in your medication.  If you are unable to keep an appointment call to reschedule.  Be sure to follow up with resources and follow ups given. In the event of worsening symptoms call the crisis hotline, 911, and or go to the nearest emergency department for appropriate evaluation and treatment of symptoms. Follow-up with your primary care provider for your medical issues, concerns and or health care needs.   Substance abuse resources and Residential Options:  [facility] day residential substance abuse facility (not an option if you have active assault charges). [address] [place], [place] [address] Phone: [phone]: Ask for Drema Pry in admissions to complete intake if interested in pursuing this option.  [facility]: Can get intake scheduled; (not an option if you have active assault charges). [address] [place], [place] [phone]) Call Mon-Fri.  [facility]): (offers outpatient therapy and intensive outpatient substance abuse therapy).  [address] [place], [place] [address] Phone: [phone]  [facility]: Offers FREE recovery skills classes, support groups, 1:1 Peer Support, and Compeer Classes. [address], [place], [place] [address] Phone: [phone] (Call to complete intake).   [facility] [address] [place], [place] [address] Phone: [phone] ext [phone]  [facility] provides food, shelter and other programs and services to the homeless men of [place] through our [employer]  program.  By offering safe shelter, three meals a day, clean clothing, Biblical counseling, financial planning, vocational training, GED/education and employment assistance, we've helped mend the shattered lives of many homeless men since opening in 1974.  We have approximately 267 beds available, with a max of 312 beds including mats for emergency situations and currently house an average of 270 men a night.  Prospective Client Check-In Information Photo ID Required (State/ Out of State/ [facility]) - if photo ID is not available, clients are required to have a printout of a police/sheriff's criminal history report. Help out with chores around the [facility]. No sex offender of any type (pending, charged, registered and/or any other sex related offenses) will be permitted to check in. Must be willing to abide by all rules, regulations, and policies established by the [employer]. The following will be provided - shelter, food, clothing, and biblical counseling. If you or someone you know is in need of assistance at our [facility] shelter in [place], [place], please call [phone] ext. [phone].   Homeless Shelter List:     Health and safety inspector [facility] [place])  [phone] [place], [place]  Phone: [phone]     [facility]  [address] [place], [place] [address]  Phone: [phone]     [facility] (Women only)  [address]yril Loosen [place], [place] [address]  Phone: [phone]     [facility]  [address], [place] [address]  Phone: [phone]     [facility]:  [phone]. [address]  [place], [place] [address]  Phone: [phone]     [facility]  [address], [place]  [place], [place] [address]  (Check in at 6:00PM for placement at a local shelter)  Phone: [phone]

## 2020-05-05 NOTE — ED Notes (Signed)
Pt educated on avs. Verbalized understanding. Escorted to retrieve belongings. Ambulated per self. No new issues noted. Escorted out to safe transport. Stable at time of d/c
